# Patient Record
Sex: Female | Born: 1990 | Race: White | Hispanic: No | Marital: Single | Smoking: Never smoker
Health system: Southern US, Community
[De-identification: ages and names within clinical notes are randomized; demographics above are authoritative.]

## PROBLEM LIST (undated history)

## (undated) DIAGNOSIS — K5900 Constipation, unspecified: Secondary | ICD-10-CM

## (undated) DIAGNOSIS — R51 Headache: Secondary | ICD-10-CM

## (undated) DIAGNOSIS — K5909 Other constipation: Secondary | ICD-10-CM

## (undated) DIAGNOSIS — Z5189 Encounter for other specified aftercare: Secondary | ICD-10-CM

## (undated) DIAGNOSIS — N926 Irregular menstruation, unspecified: Secondary | ICD-10-CM

## (undated) DIAGNOSIS — N946 Dysmenorrhea, unspecified: Secondary | ICD-10-CM

## (undated) HISTORY — DX: Other constipation: K59.09

## (undated) HISTORY — DX: Dysmenorrhea, unspecified: N94.6

## (undated) HISTORY — PX: WISDOM TOOTH EXTRACTION: SHX21

## (undated) HISTORY — DX: Constipation, unspecified: K59.00

## (undated) HISTORY — DX: Irregular menstruation, unspecified: N92.6

## (undated) HISTORY — DX: Headache: R51

## (undated) HISTORY — DX: Encounter for other specified aftercare: Z51.89

---
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
## 2010-02-26 ENCOUNTER — Ambulatory Visit: Payer: Self-pay | Admitting: Internal Medicine

## 2010-02-26 DIAGNOSIS — R51 Headache: Secondary | ICD-10-CM | POA: Insufficient documentation

## 2010-02-26 DIAGNOSIS — R519 Headache, unspecified: Secondary | ICD-10-CM | POA: Insufficient documentation

## 2010-02-26 HISTORY — DX: Headache: R51

## 2010-05-08 NOTE — Assessment & Plan Note (Signed)
Summary: HA'S/SSC   Vital Signs:  Patient profile:   20 year old female Menstrual status:  regular LMP:     02/22/2010 Height:      63 inches Weight:      166 pounds BMI:     29.51 Temp:     98.3 degrees F oral Pulse rate:   78 / minute BP sitting:   120 / 80  (right arm) Cuff size:   regular  Vitals Entered By: Romualdo Bolk, CMA Duncan Dull) (February 26, 2010 2:33 PM) CC: Ha's since 11/15 daily since then. No sensitivity to light or sound, No nausea or vomiting. Pt states that she has pressure around her eyes and sometimes it's hard to keep them open., Headache LMP (date): 02/22/2010 LMP - Character: heavy Menarche (age onset years): 12   Menses interval (days): 28 Menstrual flow (days): 7 Menstrual Status regular Enter LMP: 02/22/2010   History of Present Illness: Zakaiya Lares comes in today  for sda for above problem . Onset  5 days ago  with fontal HA  and described as   continuous   but ibuprofen helps some. and then comes back. Onset when    going to event  evening.   Rest helped it and then began again.    worse as day went on. She describes this as a draining HA and tired  hard to function  .   Has tried aleve and didnt help.   frontal HA and to eyes   with throbbing pain .  no vision changes.  Severity 7-10/10 Habits: caffiene  coffee in am  MWF for classes. Etoh: non  since april Artificial sweetners: moms tea.  Schedule no change   no allergy cold symptoms.  Eats 2 meals per day. LMP thursday.  Pattern HA : minor HAs that resolve with rests about    once a months. Takes no meds fot this Fam hx of HA: neg  with mom and sibs.    GYNE:to start  loestin   per Dr Vincente Poli for period control  but to start the end of this week.    Preventive Screening-Counseling & Management  Alcohol-Tobacco     Alcohol drinks/day: 0     Smoking Status: never  Caffeine-Diet-Exercise     Caffeine use/day: 1     Does Patient Exercise: yes  Current Medications (verified): 1)   Loestrin 24 Fe 1-20 Mg-Mcg Tabs (Norethin Ace-Eth Estrad-Fe)  Allergies (verified): No Known Drug Allergies  Past History:  Past medical, surgical, family and social histories (including risk factors) reviewed, and no changes noted (except as noted below).  Past Medical History: Reviewed history from 12/28/2007 and no changes required. hx constipation  Past History:  Care Management: Gynecology: Dr. Adalberto Ill  Family History: Reviewed history and no changes required. Father: Healthy Mother: Healthy Siblings: Healthy  Social History: Reviewed history from 12/28/2007 and no changes required. Single  student  Never Smoked Alcohol use-no Drug use-no Regular exercise-yes   Caffeine use/day:  1  Review of Systems       neg cv pulmonary Gi Gu or skin related  issues.  rest as per HPI  Physical Exam  General:  Well-developed,well-nourished,in no acute distress; alert,appropriate and cooperative throughout examination  looks tired  Head:  normocephalic and atraumatic.   Eyes:  PERRL, EOMs full, conjunctiva clear  Ears:  R ear normal, L ear normal, and no external deformities.   Nose:  no external deformity, no external erythema, and no nasal discharge.  Mouth:  good dentition and pharynx pink and moist.   Neck:  No deformities, masses, or tenderness noted. Lungs:  Normal respiratory effort, chest expands symmetrically. Lungs are clear to auscultation, no crackles or wheezes. Heart:  Normal rate and regular rhythm. S1 and S2 normal without gallop, murmur, click, rub or other extra sounds.no lifts.   Abdomen:  Bowel sounds positive,abdomen soft and non-tender without masses, organomegaly or hernias noted. Pulses:  pulses intact without delay   Extremities:  no clubbing cyanosis or edema  Neurologic:  alert & oriented X3, cranial nerves II-XII intact, strength normal in all extremities, gait normal, and Romberg negative.  excellent balance  Skin:  turgor normal, color normal,  no ecchymoses, and no petechiae.   Cervical Nodes:  No lymphadenopathy noted Psych:  Oriented X3, normally interactive, good eye contact, not anxious appearing, and not depressed appearing.     Impression & Recommendations:  Problem # 1:  HEADACHE (ICD-784.0) no history of significant headache but has had a number of  minor ones. Has some.migraine like  component   a patterned is more consistent with a tension headacheand no other alarm features except severity. She looks rather well for having a 10 / 10 headache but does look tired.   Options for treatment discussed.   will have her do headache calendar to monitor closely. Her updated medication list for this problem includes:    Ketoprofen 50 Mg Caps (Ketoprofen) .Marland Kitchen... 1 by mouth q6-8  hours as needed headache  Orders: Ketorolac-Toradol 15mg  (T7322) Admin of Therapeutic Inj  intramuscular or subcutaneous (02542)  Complete Medication List: 1)  Loestrin 24 Fe 1-20 Mg-mcg Tabs (Norethin ace-eth estrad-fe) 2)  Ketoprofen 50 Mg Caps (Ketoprofen) .Marland Kitchen.. 1 by mouth q6-8  hours as needed headache  Patient Instructions: 1)  avoid caffiene  2)  get enough sleep over the holiday. 3)  can take  new antiinflammatory for HA in the short term.   call if not getting better   4)  calendar any Headache you get   with your periods   in case the HA returns .  5)  Ok to start the OCps  as directed .   Contraindications/Deferment of Procedures/Staging:    Test/Procedure: FLU VAX    Reason for deferment: patient declined  Prescriptions: KETOPROFEN 50 MG CAPS (KETOPROFEN) 1 by mouth q6-8  hours as needed Headache  #20 x 1   Entered and Authorized by:   Madelin Headings MD   Signed by:   Madelin Headings MD on 02/26/2010   Method used:   Electronically to        [id number] (ret[facility])       [address] [place], [place]  [address]       Ph: [phone] or [phone]       Fax: [phone]   RxID:    [id number]    Medication Administration  Injection # 1:    Medication: Ketorolac-Toradol 15mg     Diagnosis: HEADACHE (ICD-784.0)    Route: IM    Site: LUOQ gluteus    Exp Date: 06/07/2011    Lot #: [id number]    Mfr: novaplus    Comments: Gave 60mg     Patient tolerated injection without complications    Given by: Romualdo Bolk, CMA (AAMA) (February 26, 2010 3:15 PM)  Orders Added: 1)  Ketorolac-Toradol 15mg  [J1885] 2)  Admin of  Therapeutic Inj  intramuscular or subcutaneous [96372] 3)  Est. Patient Level IV [16109]

## 2010-09-11 ENCOUNTER — Ambulatory Visit (INDEPENDENT_AMBULATORY_CARE_PROVIDER_SITE_OTHER): Payer: BC Managed Care – PPO | Admitting: Internal Medicine

## 2010-09-11 DIAGNOSIS — Z111 Encounter for screening for respiratory tuberculosis: Secondary | ICD-10-CM

## 2011-11-25 ENCOUNTER — Emergency Department (HOSPITAL_BASED_OUTPATIENT_CLINIC_OR_DEPARTMENT_OTHER)
Admission: EM | Admit: 2011-11-25 | Discharge: 2011-11-25 | Disposition: A | Discharge status: Home / Self Care | Payer: BC Managed Care – PPO | Attending: Emergency Medicine | Admitting: Emergency Medicine

## 2011-11-25 ENCOUNTER — Encounter (HOSPITAL_BASED_OUTPATIENT_CLINIC_OR_DEPARTMENT_OTHER): Payer: Self-pay | Admitting: Family Medicine

## 2011-11-25 ENCOUNTER — Emergency Department (HOSPITAL_BASED_OUTPATIENT_CLINIC_OR_DEPARTMENT_OTHER): Payer: BC Managed Care – PPO

## 2011-11-25 DIAGNOSIS — R112 Nausea with vomiting, unspecified: Secondary | ICD-10-CM | POA: Insufficient documentation

## 2011-11-25 DIAGNOSIS — K529 Noninfective gastroenteritis and colitis, unspecified: Secondary | ICD-10-CM

## 2011-11-25 DIAGNOSIS — K5289 Other specified noninfective gastroenteritis and colitis: Secondary | ICD-10-CM | POA: Insufficient documentation

## 2011-11-25 LAB — CBC WITH DIFFERENTIAL/PLATELET
Basophils Absolute: 0 10*3/uL (ref 0.0–0.1)
Lymphocytes Relative: 14 % (ref 12–46)
Lymphs Abs: 1.5 10*3/uL (ref 0.7–4.0)
MCV: 86.5 fL (ref 78.0–100.0)
Neutro Abs: 8.4 10*3/uL — ABNORMAL HIGH (ref 1.7–7.7)
Neutrophils Relative %: 79 % — ABNORMAL HIGH (ref 43–77)
Platelets: 338 10*3/uL (ref 150–400)
RBC: 4.68 MIL/uL (ref 3.87–5.11)
WBC: 10.6 10*3/uL — ABNORMAL HIGH (ref 4.0–10.5)

## 2011-11-25 LAB — COMPREHENSIVE METABOLIC PANEL
ALT: 20 U/L (ref 0–35)
AST: 23 U/L (ref 0–37)
Alkaline Phosphatase: 91 U/L (ref 39–117)
CO2: 25 mEq/L (ref 19–32)
Chloride: 106 mEq/L (ref 96–112)
GFR calc Af Amer: >90 mL/min (ref >90)
GFR calc non Af Amer: >90 mL/min (ref >90)
Glucose, Bld: 110 mg/dL — ABNORMAL HIGH (ref 70–99)
Potassium: 4.7 mEq/L (ref 3.5–5.1)
Sodium: 141 mEq/L (ref 135–145)

## 2011-11-25 LAB — URINALYSIS, ROUTINE W REFLEX MICROSCOPIC
Bilirubin Urine: NEGATIVE
Glucose, UA: NEGATIVE mg/dL
Hgb urine dipstick: NEGATIVE
Protein, ur: NEGATIVE mg/dL
Urobilinogen, UA: 0.2 mg/dL (ref 0.0–1.0)

## 2011-11-25 LAB — PREGNANCY, URINE: Preg Test, Ur: NEGATIVE

## 2011-11-25 MED ORDER — ONDANSETRON 8 MG PO TBDP: 8.0000 mg | ORAL_TABLET | Freq: Three times a day (TID) | ORAL | Status: AC | PRN

## 2011-11-25 MED ORDER — SODIUM CHLORIDE 0.9 % IV BOLUS (SEPSIS)
1000.0000 mL | Freq: Once | INTRAVENOUS | Status: AC
  Administered 2011-11-25: 1000 mL via INTRAVENOUS

## 2011-11-25 MED ORDER — SODIUM CHLORIDE 0.9 % IV SOLN
80.0000 mg | Freq: Once | INTRAVENOUS | Status: AC
  Administered 2011-11-25: 80 mg via INTRAVENOUS
  Filled 2011-11-25: qty 40

## 2011-11-25 MED ORDER — HYDROMORPHONE HCL PF 1 MG/ML IJ SOLN
1.0000 mg | Freq: Once | INTRAMUSCULAR | Status: AC
  Administered 2011-11-25: 1 mg via INTRAVENOUS
  Filled 2011-11-25 (×2): qty 1

## 2011-11-25 MED ORDER — ONDANSETRON HCL 4 MG/2ML IJ SOLN
4.0000 mg | Freq: Once | INTRAMUSCULAR | Status: AC
  Administered 2011-11-25: 4 mg via INTRAVENOUS
  Filled 2011-11-25: qty 2

## 2011-11-25 NOTE — ED Provider Notes (Signed)
History     CSN: [id number]  Arrival date & time 11/25/11  1033   First MD Initiated Contact with Patient 11/25/11 1053      Chief Complaint  Patient presents with  . Abdominal Pain    (Consider location/radiation/quality/duration/timing/severity/associated sxs/prior treatment) HPI Patient with abdominal pain off and on since Saturday.  Nausea, vomiting and loose stool.No uti symptoms (although nurses notes state dysuria).  No sexual activity, vaginal discharge, or bleeding.    History reviewed. No pertinent past medical history.  Past Surgical History  Procedure Date  . Wisdom tooth extraction     No family history on file.  History  Substance Use Topics  . Smoking status: Never Smoker   . Smokeless tobacco: Not on file  . Alcohol Use: No    OB History    Grav Para Term Preterm Abortions TAB SAB Ect Mult Living                  Review of Systems  All other systems reviewed and are negative.    Allergies  Review of patient's allergies indicates no known allergies.  Home Medications  No current outpatient prescriptions on file.  BP 116/58  Pulse 55  Temp 97.8 F (36.6 C) (Oral)  Resp 16  Ht 5\' 2"  (1.575 m)  Wt 142 lb (64.411 kg)  BMI 25.97 kg/m2  SpO2 100%  LMP 10/28/2011  Physical Exam  Nursing note and vitals reviewed. Constitutional: She appears well-developed and well-nourished.  HENT:  Head: Normocephalic and atraumatic.  Eyes: Conjunctivae and EOM are normal. Pupils are equal, round, and reactive to light.  Neck: Normal range of motion. Neck supple.  Cardiovascular: Normal rate, regular rhythm, normal heart sounds and intact distal pulses.   Pulmonary/Chest: Effort normal and breath sounds normal.  Abdominal: Soft. Bowel sounds are normal. There is tenderness.       ruq and epigastric tenderness  Musculoskeletal: Normal range of motion.  Neurological: She is alert.  Skin: Skin is warm and dry.  Psychiatric: She has a normal mood and  affect. Thought content normal.    ED Course  Procedures (including critical care time)  Labs Reviewed  CBC WITH DIFFERENTIAL - Abnormal; Notable for the following:    WBC 10.6 (*)     Neutrophils Relative 79 (*)     Neutro Abs 8.4 (*)     All other components within normal limits  COMPREHENSIVE METABOLIC PANEL - Abnormal; Notable for the following:    Glucose, Bld 110 (*)     Total Protein 8.4 (*)     Total Bilirubin 0.2 (*)     All other components within normal limits  LIPASE, BLOOD  URINALYSIS, ROUTINE W REFLEX MICROSCOPIC  PREGNANCY, URINE   US Abdomen Complete  11/25/2011  *RADIOLOGY REPORT*  Clinical Data:  Abdominal pain.  Nausea and vomiting.  ABDOMINAL ULTRASOUND COMPLETE  Comparison:  None.  Findings:  Gallbladder:  No gallstones, gallbladder wall thickening, or pericholecystic fluid.  Common Bile Duct:  Within normal limits in caliber. Measures 4 mm in diameter.  Liver: No focal mass lesion identified.  Within normal limits in parenchymal echogenicity.  IVC:  Appears normal.  Pancreas:  No abnormality identified.  Spleen:  Within normal limits in size and echotexture.  Right kidney:  Normal in size and parenchymal echogenicity.  No evidence of mass or hydronephrosis.  Left kidney:  Normal in size and parenchymal echogenicity.  No evidence of mass or hydronephrosis.  Abdominal Aorta:  No  aneurysm identified.  IMPRESSION: Negative abdominal ultrasound.   Original Report Authenticated By: Danae Orleans, M.D. ( 11/25/2011 12:15:38 )      No diagnosis found.    MDM  Patient with negative work up and feel improved.  Abdomen mildly tender in ruq and epigastrium.  No lower abdominal pain.        Hilario Quarry, MD 11/25/11 [phone]

## 2011-11-25 NOTE — ED Notes (Signed)
Pt requesting pain medication at present.

## 2011-11-25 NOTE — ED Notes (Signed)
Pt c/o diffuse abdominal pain x 2 days with 1 episode of vomiting and "soft stool". Last bowel movement this morning. Pt also c/o dysuria.

## 2011-11-25 NOTE — ED Notes (Signed)
Patient transported to Ultrasound 

## 2011-11-25 NOTE — ED Notes (Signed)
Pt reports nausea is gone and pain is not as bad. Pt smiling and talkative. nad noted.

## 2011-11-25 NOTE — ED Notes (Signed)
Prior to giving pain medication pt was ambulatory to the rest room and states she is nauseated.

## 2012-01-28 ENCOUNTER — Encounter: Payer: Self-pay | Admitting: Internal Medicine

## 2012-01-28 ENCOUNTER — Ambulatory Visit (INDEPENDENT_AMBULATORY_CARE_PROVIDER_SITE_OTHER): Payer: BC Managed Care – PPO | Admitting: Internal Medicine

## 2012-01-28 VITALS — BP 112/60 | HR 63 | Temp 98.2°F | Ht 63.0 in | Wt 155.0 lb

## 2012-01-28 DIAGNOSIS — Z0000 Encounter for general adult medical examination without abnormal findings: Secondary | ICD-10-CM

## 2012-01-28 DIAGNOSIS — Z23 Encounter for immunization: Secondary | ICD-10-CM

## 2012-01-28 DIAGNOSIS — Z111 Encounter for screening for respiratory tuberculosis: Secondary | ICD-10-CM

## 2012-01-28 DIAGNOSIS — E049 Nontoxic goiter, unspecified: Secondary | ICD-10-CM

## 2012-01-28 LAB — BASIC METABOLIC PANEL
CO2: 28 mEq/L (ref 19–32)
Calcium: 9.4 mg/dL (ref 8.4–10.5)
Chloride: 106 mEq/L (ref 96–112)
Creatinine, Ser: 0.7 mg/dL (ref 0.4–1.2)
Glucose, Bld: 86 mg/dL (ref 70–99)
Sodium: 140 mEq/L (ref 135–145)

## 2012-01-28 LAB — CBC WITH DIFFERENTIAL/PLATELET
Basophils Absolute: 0 10*3/uL (ref 0.0–0.1)
Basophils Relative: 0.4 % (ref 0.0–3.0)
Eosinophils Absolute: 0.1 10*3/uL (ref 0.0–0.7)
Hemoglobin: 13.5 g/dL (ref 12.0–15.0)
Lymphocytes Relative: 28.3 % (ref 12.0–46.0)
MCHC: 32.8 g/dL (ref 30.0–36.0)
MCV: 90.8 fl (ref 78.0–100.0)
Monocytes Absolute: 0.4 10*3/uL (ref 0.1–1.0)
Neutro Abs: 3.9 10*3/uL (ref 1.4–7.7)
RBC: 4.55 Mil/uL (ref 3.87–5.11)
RDW: 13.2 % (ref 11.5–14.6)

## 2012-01-28 LAB — LIPID PANEL
Cholesterol: 147 mg/dL (ref 0–200)
HDL: 41.2 mg/dL (ref >39.00)
Triglycerides: 93 mg/dL (ref 0.0–149.0)
VLDL: 18.6 mg/dL (ref 0.0–40.0)

## 2012-01-28 LAB — HEPATIC FUNCTION PANEL
Albumin: 4.2 g/dL (ref 3.5–5.2)
Total Protein: 7.7 g/dL (ref 6.0–8.3)

## 2012-01-28 NOTE — Patient Instructions (Signed)
Get copy of immunization records. PPD today come back for reading  And then will need another  After that ( for booster effect.)  May need second meneingitis or mcv 4 but could choose to not do this. May need booster tdap. Will notify you  of labs when available.   Continue lifestyle intervention healthy eating and exercise . Preventive Care for Adults, Female A healthy lifestyle and preventive care can promote health and wellness. Preventive health guidelines for women include the following key practices.  A routine yearly physical is a good way to check with your caregiver about your health and preventive screening. It is a chance to share any concerns and updates on your health, and to receive a thorough exam.  Visit your dentist for a routine exam and preventive care every 6 months. Brush your teeth twice a day and floss once a day. Good oral hygiene prevents tooth decay and gum disease.  The frequency of eye exams is based on your age, health, family medical history, use of contact lenses, and other factors. Follow your caregiver's recommendations for frequency of eye exams.  Eat a healthy diet. Foods like vegetables, fruits, whole grains, low-fat dairy products, and lean protein foods contain the nutrients you need without too many calories. Decrease your intake of foods high in solid fats, added sugars, and salt. Eat the right amount of calories for you.Get information about a proper diet from your caregiver, if necessary.  Regular physical exercise is one of the most important things you can do for your health. Most adults should get at least 150 minutes of moderate-intensity exercise (any activity that increases your heart rate and causes you to sweat) each week. In addition, most adults need muscle-strengthening exercises on 2 or more days a week.  Maintain a healthy weight. The body mass index (BMI) is a screening tool to identify possible weight problems. It provides an estimate of  body fat based on height and weight. Your caregiver can help determine your BMI, and can help you achieve or maintain a healthy weight.For adults 20 years and older:  A BMI below 18.5 is considered underweight.  A BMI of 18.5 to 24.9 is normal.  A BMI of 25 to 29.9 is considered overweight.  A BMI of 30 and above is considered obese.  Maintain normal blood lipids and cholesterol levels by exercising and minimizing your intake of saturated fat. Eat a balanced diet with plenty of fruit and vegetables. Blood tests for lipids and cholesterol should begin at age 85 and be repeated every 5 years. If your lipid or cholesterol levels are high, you are over 50, or you are at high risk for heart disease, you may need your cholesterol levels checked more frequently.Ongoing high lipid and cholesterol levels should be treated with medicines if diet and exercise are not effective.  If you smoke, find out from your caregiver how to quit. If you do not use tobacco, do not start.  If you are pregnant, do not drink alcohol. If you are breastfeeding, be very cautious about drinking alcohol. If you are not pregnant and choose to drink alcohol, do not exceed 1 drink per day. One drink is considered to be 12 ounces (355 mL) of beer, 5 ounces (148 mL) of wine, or 1.5 ounces (44 mL) of liquor.  Avoid use of street drugs. Do not share needles with anyone. Ask for help if you need support or instructions about stopping the use of drugs.  High blood pressure  causes heart disease and increases the risk of stroke. Your blood pressure should be checked at least every 1 to 2 years. Ongoing high blood pressure should be treated with medicines if weight loss and exercise are not effective.  If you are 69 to 21 years old, ask your caregiver if you should take aspirin to prevent strokes.  Diabetes screening involves taking a blood sample to check your fasting blood sugar level. This should be done once every 3 years, after age  60, if you are within normal weight and without risk factors for diabetes. Testing should be considered at a younger age or be carried out more frequently if you are overweight and have at least 1 risk factor for diabetes.  Breast cancer screening is essential preventive care for women. You should practice "breast self-awareness." This means understanding the normal appearance and feel of your breasts and may include breast self-examination. Any changes detected, no matter how small, should be reported to a caregiver. Women in their 64s and 30s should have a clinical breast exam (CBE) by a caregiver as part of a regular health exam every 1 to 3 years. After age 61, women should have a CBE every year. Starting at age 65, women should consider having a mammography (breast X-ray test) every year. Women who have a family history of breast cancer should talk to their caregiver about genetic screening. Women at a high risk of breast cancer should talk to their caregivers about having magnetic resonance imaging (MRI) and a mammography every year.  The Pap test is a screening test for cervical cancer. A Pap test can show cell changes on the cervix that might become cervical cancer if left untreated. A Pap test is a procedure in which cells are obtained and examined from the lower end of the uterus (cervix).  Women should have a Pap test starting at age 64.  Between ages 29 and 44, Pap tests should be repeated every 2 years.  Beginning at age 66, you should have a Pap test every 3 years as long as the past 3 Pap tests have been normal.  Some women have medical problems that increase the chance of getting cervical cancer. Talk to your caregiver about these problems. It is especially important to talk to your caregiver if a new problem develops soon after your last Pap test. In these cases, your caregiver may recommend more frequent screening and Pap tests.  The above recommendations are the same for women who  have or have not gotten the vaccine for human papillomavirus (HPV).  If you had a hysterectomy for a problem that was not cancer or a condition that could lead to cancer, then you no longer need Pap tests. Even if you no longer need a Pap test, a regular exam is a good idea to make sure no other problems are starting.  If you are between ages 62 and 24, and you have had normal Pap tests going back 10 years, you no longer need Pap tests. Even if you no longer need a Pap test, a regular exam is a good idea to make sure no other problems are starting.  If you have had past treatment for cervical cancer or a condition that could lead to cancer, you need Pap tests and screening for cancer for at least 20 years after your treatment.  If Pap tests have been discontinued, risk factors (such as a new sexual partner) need to be reassessed to determine if screening should be  resumed.  The HPV test is an additional test that may be used for cervical cancer screening. The HPV test looks for the virus that can cause the cell changes on the cervix. The cells collected during the Pap test can be tested for HPV. The HPV test could be used to screen women aged 37 years and older, and should be used in women of any age who have unclear Pap test results. After the age of 39, women should have HPV testing at the same frequency as a Pap test.  Colorectal cancer can be detected and often prevented. Most routine colorectal cancer screening begins at the age of 65 and continues through age 21. However, your caregiver may recommend screening at an earlier age if you have risk factors for colon cancer. On a yearly basis, your caregiver may provide home test kits to check for hidden blood in the stool. Use of a small camera at the end of a tube, to directly examine the colon (sigmoidoscopy or colonoscopy), can detect the earliest forms of colorectal cancer. Talk to your caregiver about this at age 58, when routine screening begins.  Direct examination of the colon should be repeated every 5 to 10 years through age 51, unless early forms of pre-cancerous polyps or small growths are found.  Hepatitis C blood testing is recommended for all people born from [birth year] through 1965 and any individual with known risks for hepatitis C.  Practice safe sex. Use condoms and avoid high-risk sexual practices to reduce the spread of sexually transmitted infections (STIs). STIs include gonorrhea, chlamydia, syphilis, trichomonas, herpes, HPV, and human immunodeficiency virus (HIV). Herpes, HIV, and HPV are viral illnesses that have no cure. They can result in disability, cancer, and death. Sexually active women aged 13 and younger should be checked for chlamydia. Older women with new or multiple partners should also be tested for chlamydia. Testing for other STIs is recommended if you are sexually active and at increased risk.  Osteoporosis is a disease in which the bones lose minerals and strength with aging. This can result in serious bone fractures. The risk of osteoporosis can be identified using a bone density scan. Women ages 23 and over and women at risk for fractures or osteoporosis should discuss screening with their caregivers. Ask your caregiver whether you should take a calcium supplement or vitamin D to reduce the rate of osteoporosis.  Menopause can be associated with physical symptoms and risks. Hormone replacement therapy is available to decrease symptoms and risks. You should talk to your caregiver about whether hormone replacement therapy is right for you.  Use sunscreen with sun protection factor (SPF) of 30 or more. Apply sunscreen liberally and repeatedly throughout the day. You should seek shade when your shadow is shorter than you. Protect yourself by wearing long sleeves, pants, a wide-brimmed hat, and sunglasses year round, whenever you are outdoors.  Once a month, do a whole body skin exam, using a mirror to look at the skin  on your back. Notify your caregiver of new moles, moles that have irregular borders, moles that are larger than a pencil eraser, or moles that have changed in shape or color.  Stay current with required immunizations.  Influenza. You need a dose every fall (or winter). The composition of the flu vaccine changes each year, so being vaccinated once is not enough.  Pneumococcal polysaccharide. You need 1 to 2 doses if you smoke cigarettes or if you have certain chronic medical conditions. You need 1  dose at age 25 (or older) if you have never been vaccinated.  Tetanus, diphtheria, pertussis (Tdap, Td). Get 1 dose of Tdap vaccine if you are younger than age 9, are over 21 and have contact with an infant, are a Research scientist (physical sciences), are pregnant, or simply want to be protected from whooping cough. After that, you need a Td booster dose every 10 years. Consult your caregiver if you have not had at least 3 tetanus and diphtheria-containing shots sometime in your life or have a deep or dirty wound.  HPV. You need this vaccine if you are a woman age 79 or younger. The vaccine is given in 3 doses over 6 months.  Measles, mumps, rubella (MMR). You need at least 1 dose of MMR if you were born in 1957 or later. You may also need a second dose.  Meningococcal. If you are age 61 to 55 and a first-year college student living in a residence hall, or have one of several medical conditions, you need to get vaccinated against meningococcal disease. You may also need additional booster doses.  Zoster (shingles). If you are age 41 or older, you should get this vaccine.  Varicella (chickenpox). If you have never had chickenpox or you were vaccinated but received only 1 dose, talk to your caregiver to find out if you need this vaccine.  Hepatitis A. You need this vaccine if you have a specific risk factor for hepatitis A virus infection or you simply wish to be protected from this disease. The vaccine is usually given as  2 doses, 6 to 18 months apart.  Hepatitis B. You need this vaccine if you have a specific risk factor for hepatitis B virus infection or you simply wish to be protected from this disease. The vaccine is given in 3 doses, usually over 6 months. Preventive Services / Frequency Ages 17 to 34  Blood pressure check.** / Every 1 to 2 years.  Lipid and cholesterol check.** / Every 5 years beginning at age 53.  Clinical breast exam.** / Every 3 years for women in their 53s and 30s.  Pap test.** / Every 2 years from ages 11 through 69. Every 3 years starting at age 26 through age 7 or 35 with a history of 3 consecutive normal Pap tests.  HPV screening.** / Every 3 years from ages 19 through ages 54 to 10 with a history of 3 consecutive normal Pap tests.  Hepatitis C blood test.** / For any individual with known risks for hepatitis C.  Skin self-exam. / Monthly.  Influenza immunization.** / Every year.  Pneumococcal polysaccharide immunization.** / 1 to 2 doses if you smoke cigarettes or if you have certain chronic medical conditions.  Tetanus, diphtheria, pertussis (Tdap, Td) immunization. / A one-time dose of Tdap vaccine. After that, you need a Td booster dose every 10 years.  HPV immunization. / 3 doses over 6 months, if you are 64 and younger.  Measles, mumps, rubella (MMR) immunization. / You need at least 1 dose of MMR if you were born in 1957 or later. You may also need a second dose.  Meningococcal immunization. / 1 dose if you are age 5 to 31 and a first-year college student living in a residence hall, or have one of several medical conditions, you need to get vaccinated against meningococcal disease. You may also need additional booster doses.  Varicella immunization.** / Consult your caregiver.  Hepatitis A immunization.** / Consult your caregiver. 2 doses, 6 to 18 months  apart.  Hepatitis B immunization.** / Consult your caregiver. 3 doses usually over 6 months. Ages 16 to  54  Blood pressure check.** / Every 1 to 2 years.  Lipid and cholesterol check.** / Every 5 years beginning at age 58.  Clinical breast exam.** / Every year after age 68.  Mammogram.** / Every year beginning at age 46 and continuing for as long as you are in good health. Consult with your caregiver.  Pap test.** / Every 3 years starting at age 74 through age 76 or 54 with a history of 3 consecutive normal Pap tests.  HPV screening.** / Every 3 years from ages 8 through ages 33 to 77 with a history of 3 consecutive normal Pap tests.  Fecal occult blood test (FOBT) of stool. / Every year beginning at age 33 and continuing until age 29. You may not need to do this test if you get a colonoscopy every 10 years.  Flexible sigmoidoscopy or colonoscopy.** / Every 5 years for a flexible sigmoidoscopy or every 10 years for a colonoscopy beginning at age 23 and continuing until age 2.  Hepatitis C blood test.** / For all people born from [birth year] through 1965 and any individual with known risks for hepatitis C.  Skin self-exam. / Monthly.  Influenza immunization.** / Every year.  Pneumococcal polysaccharide immunization.** / 1 to 2 doses if you smoke cigarettes or if you have certain chronic medical conditions.  Tetanus, diphtheria, pertussis (Tdap, Td) immunization.** / A one-time dose of Tdap vaccine. After that, you need a Td booster dose every 10 years.  Measles, mumps, rubella (MMR) immunization. / You need at least 1 dose of MMR if you were born in 1957 or later. You may also need a second dose.  Varicella immunization.** / Consult your caregiver.  Meningococcal immunization.** / Consult your caregiver.  Hepatitis A immunization.** / Consult your caregiver. 2 doses, 6 to 18 months apart.  Hepatitis B immunization.** / Consult your caregiver. 3 doses, usually over 6 months. Ages 35 and over  Blood pressure check.** / Every 1 to 2 years.  Lipid and cholesterol check.** / Every 5 years  beginning at age 77.  Clinical breast exam.** / Every year after age 81.  Mammogram.** / Every year beginning at age 89 and continuing for as long as you are in good health. Consult with your caregiver.  Pap test.** / Every 3 years starting at age 47 through age 27 or 73 with a 3 consecutive normal Pap tests. Testing can be stopped between 65 and 70 with 3 consecutive normal Pap tests and no abnormal Pap or HPV tests in the past 10 years.  HPV screening.** / Every 3 years from ages 23 through ages 30 or 82 with a history of 3 consecutive normal Pap tests. Testing can be stopped between 65 and 70 with 3 consecutive normal Pap tests and no abnormal Pap or HPV tests in the past 10 years.  Fecal occult blood test (FOBT) of stool. / Every year beginning at age 73 and continuing until age 79. You may not need to do this test if you get a colonoscopy every 10 years.  Flexible sigmoidoscopy or colonoscopy.** / Every 5 years for a flexible sigmoidoscopy or every 10 years for a colonoscopy beginning at age 35 and continuing until age 8.  Hepatitis C blood test.** / For all people born from [birth year] through 1965 and any individual with known risks for hepatitis C.  Osteoporosis screening.** / A one-time screening  for women ages 78 and over and women at risk for fractures or osteoporosis.  Skin self-exam. / Monthly.  Influenza immunization.** / Every year.  Pneumococcal polysaccharide immunization.** / 1 dose at age 38 (or older) if you have never been vaccinated.  Tetanus, diphtheria, pertussis (Tdap, Td) immunization. / A one-time dose of Tdap vaccine if you are over 65 and have contact with an infant, are a Research scientist (physical sciences), or simply want to be protected from whooping cough. After that, you need a Td booster dose every 10 years.  Varicella immunization.** / Consult your caregiver.  Meningococcal immunization.** / Consult your caregiver.  Hepatitis A immunization.** / Consult your caregiver. 2  doses, 6 to 18 months apart.  Hepatitis B immunization.** / Check with your caregiver. 3 doses, usually over 6 months. ** Family history and personal history of risk and conditions may change your caregiver's recommendations. Document Released: 05/21/2001 Document Revised: 06/17/2011 Document Reviewed: 08/20/2010 [facility] Patient Information 2013 [place], [place].

## 2012-01-28 NOTE — Progress Notes (Signed)
  Subjective:    Patient ID: Angela Jordan, female    DOB: [birth year], 21 y.o.   MRN: [id number]  HPI Pt comes in today for wellness visit  And forms and update immuniz for school . Her last visit here was 11/ 11; 2 years ago  Since that time: irreg menses  Sees dr Vincente Poli. Doing better  No more headaches  No major injuries of illnesses  Had varicella as a child  Hs had gardisil vaccine x 23  ? Last tdap  MCV4 .  Needs ppd 2 step for program no hx of positive test or sx of tb.  Sees eye doc has glasses   Review of Systems ROS:  GEN/ HEENT: No fever, significant weight changes sweats headaches vision problems hearing changes, CV/ PULM; No chest pain shortness of breath cough, syncope,edema  change in exercise tolerance. GI /GU: No adominal pain, vomiting, change in bowel habits. No blood in the stool. No significant GU symptoms. SKIN/HEME: ,no acute skin rashes suspicious lesions or bleeding. No lymphadenopathy, nodules, masses.  NEURO/ PSYCH:  No neurologic signs such as weakness numbness. No depression anxiety. IMM/ Allergy: No unusual infections.  Allergy .   REST of 12 system review negative except as per HPI Past history family history social history reviewed in the electronic medical record. fam hx [place]    Objective:   Physical Exam BP 112/60  Pulse 63  Temp 98.2 F (36.8 C) (Oral)  Ht 5\' 3"  (1.6 m)  Wt 155 lb (70.308 kg)  BMI 27.46 kg/m2  SpO2 99%  LMP 01/25/2012 Physical Exam: Vital signs reviewed WUJ:WJXB is a well-developed well-nourished alert cooperative  white female who appears her stated age in no acute distress.  HEENT: normocephalic atraumatic , Eyes: PERRL EOM's full, conjunctiva clear, Nares: paten,t no deformity discharge or tenderness., Ears: no deformity EAC's clear TMs with normal landmarks. Mouth: clear OP, no lesions, edema.  Moist mucous membranes. Dentition in adequate repair. NECK: supple without masses, or bruits. ? Palpable thyroid gland  No  nodules CHEST/PULM:  Clear to auscultation and percussion breath sounds equal no wheeze , rales or rhonchi. No chest wall deformities or tenderness. CV: PMI is nondisplaced, S1 S2 no gallops, murmurs, rubs. Peripheral pulses are full without delay.No JVD .  ABDOMEN: Bowel sounds normal nontender  No guard or rebound, no hepato splenomegal no CVA tenderness.  No hernia. Extremtities:  No clubbing cyanosis or edema, no acute joint swelling or redness no focal atrophy NEURO:  Oriented x3, cranial nerves 3-12 appear to be intact, no obvious focal weakness,gait within normal limits no abnormal reflexes or asymmetrical SKIN: No acute rashes normal turgor, color, no bruising or petechiae. PSYCH: Oriented, good eye contact, no obvious depression anxiety, cognition and judgment appear normal. LN: no cervical axillary inguinal adenopathy  / MS exam: normal;  No scoliosis ,LOM , joint swelling or gait disturbance . Muscle mass is normal .     Assessment & Plan:  Preventive Health Care Counseled regarding healthy nutrition, exercise, sleep, injury prevention, calcium vit d and healthy weight . Flu vaccine  Mist.  Get copy of immunization   from school as they are not in registry and  Not done here. May need update tdap get serology for varicella and cpx labs and screening.  PPD and return for 2 step process Form reviewed and signed  ? Of a goiter get labs today follow yearly  No fam hjx

## 2012-01-29 LAB — HIV ANTIBODY (ROUTINE TESTING W REFLEX): HIV: NONREACTIVE

## 2012-01-29 LAB — RPR

## 2012-01-29 LAB — THYROID ANTIBODIES
Thyroglobulin Ab: <20 U/mL (ref <40.0)
Thyroperoxidase Ab SerPl-aCnc: 15.5 IU/mL (ref <35.0)

## 2012-01-30 LAB — TB SKIN TEST: TB Skin Test: NEGATIVE

## 2012-02-01 ENCOUNTER — Encounter: Payer: Self-pay | Admitting: Internal Medicine

## 2012-02-01 DIAGNOSIS — Z0000 Encounter for general adult medical examination without abnormal findings: Secondary | ICD-10-CM | POA: Insufficient documentation

## 2012-02-01 DIAGNOSIS — E049 Nontoxic goiter, unspecified: Secondary | ICD-10-CM | POA: Insufficient documentation

## 2012-02-01 DIAGNOSIS — Z111 Encounter for screening for respiratory tuberculosis: Secondary | ICD-10-CM | POA: Insufficient documentation

## 2012-02-04 ENCOUNTER — Encounter: Payer: Self-pay | Admitting: Family Medicine

## 2012-02-05 ENCOUNTER — Telehealth: Payer: Self-pay | Admitting: Internal Medicine

## 2012-02-05 DIAGNOSIS — Z111 Encounter for screening for respiratory tuberculosis: Secondary | ICD-10-CM

## 2012-02-05 NOTE — Telephone Encounter (Signed)
We can get a chest x ray but she doesn't have a positive PPD by definition of 8 mm induration.  Is that what  She is saying ?

## 2012-02-05 NOTE — Telephone Encounter (Signed)
Spoke to the pt.  She stated her school program wants her to proceed with getting a chest x-ray before the second ppd.  Please advise.  Thanks!!

## 2012-02-05 NOTE — Telephone Encounter (Signed)
Pt called and said that she needs to get a chest xray ordered since she has has positive tb reading before. Pt is schd for tb test on 02/12/12 and would like to get xray done before then.

## 2012-02-06 ENCOUNTER — Encounter: Payer: Self-pay | Admitting: Internal Medicine

## 2012-02-06 ENCOUNTER — Other Ambulatory Visit: Payer: Self-pay | Admitting: Family Medicine

## 2012-02-06 DIAGNOSIS — Z111 Encounter for screening for respiratory tuberculosis: Secondary | ICD-10-CM

## 2012-02-06 NOTE — Telephone Encounter (Signed)
What dx code should we use?

## 2012-02-06 NOTE — Telephone Encounter (Signed)
Unsure  She doesn't have a positive ppd and dont want to put that in her record.   Could do screening for lung diseas or screening for TB?

## 2012-02-07 NOTE — Telephone Encounter (Signed)
Orders placed in the system and pt notified to go to [place] office.

## 2012-02-12 ENCOUNTER — Ambulatory Visit (INDEPENDENT_AMBULATORY_CARE_PROVIDER_SITE_OTHER): Payer: BC Managed Care – PPO | Admitting: Family Medicine

## 2012-02-12 DIAGNOSIS — Z23 Encounter for immunization: Secondary | ICD-10-CM

## 2012-02-14 ENCOUNTER — Telehealth: Payer: Self-pay | Admitting: Family Medicine

## 2012-02-14 NOTE — Telephone Encounter (Signed)
Left message for the pt to return my call. 

## 2012-02-14 NOTE — Telephone Encounter (Signed)
i'm ok with no x ray but thought she needed it for school.  Ask pt if she still needs x ray ?

## 2012-02-14 NOTE — Telephone Encounter (Signed)
We received notification that this patient did not complete the xray ordered 02/07/12.

## 2012-02-17 ENCOUNTER — Other Ambulatory Visit: Payer: Self-pay | Admitting: Family Medicine

## 2012-02-17 NOTE — Telephone Encounter (Signed)
Spoke to the pt.  She no longer wanted x-ray.  I removed the order.

## 2013-05-07 ENCOUNTER — Emergency Department (HOSPITAL_BASED_OUTPATIENT_CLINIC_OR_DEPARTMENT_OTHER)
Admission: EM | Admit: 2013-05-07 | Discharge: 2013-05-07 | Disposition: A | Discharge status: Home / Self Care | Payer: BC Managed Care – PPO | Attending: Emergency Medicine | Admitting: Emergency Medicine

## 2013-05-07 ENCOUNTER — Emergency Department (HOSPITAL_BASED_OUTPATIENT_CLINIC_OR_DEPARTMENT_OTHER): Payer: BC Managed Care – PPO

## 2013-05-07 ENCOUNTER — Encounter (HOSPITAL_BASED_OUTPATIENT_CLINIC_OR_DEPARTMENT_OTHER): Payer: Self-pay | Admitting: Emergency Medicine

## 2013-05-07 DIAGNOSIS — R1033 Periumbilical pain: Secondary | ICD-10-CM | POA: Insufficient documentation

## 2013-05-07 DIAGNOSIS — Z8719 Personal history of other diseases of the digestive system: Secondary | ICD-10-CM | POA: Insufficient documentation

## 2013-05-07 DIAGNOSIS — N390 Urinary tract infection, site not specified: Secondary | ICD-10-CM

## 2013-05-07 DIAGNOSIS — Z8742 Personal history of other diseases of the female genital tract: Secondary | ICD-10-CM | POA: Insufficient documentation

## 2013-05-07 DIAGNOSIS — Z3202 Encounter for pregnancy test, result negative: Secondary | ICD-10-CM | POA: Insufficient documentation

## 2013-05-07 DIAGNOSIS — Z792 Long term (current) use of antibiotics: Secondary | ICD-10-CM | POA: Insufficient documentation

## 2013-05-07 DIAGNOSIS — R112 Nausea with vomiting, unspecified: Secondary | ICD-10-CM | POA: Insufficient documentation

## 2013-05-07 DIAGNOSIS — R109 Unspecified abdominal pain: Secondary | ICD-10-CM

## 2013-05-07 LAB — COMPREHENSIVE METABOLIC PANEL
ALBUMIN: 4.5 g/dL (ref 3.5–5.2)
ALT: 10 U/L (ref 0–35)
AST: 14 U/L (ref 0–37)
Alkaline Phosphatase: 92 U/L (ref 39–117)
BILIRUBIN TOTAL: 0.5 mg/dL (ref 0.3–1.2)
BUN: 12 mg/dL (ref 6–23)
CHLORIDE: 100 meq/L (ref 96–112)
CO2: 25 mEq/L (ref 19–32)
CREATININE: 0.7 mg/dL (ref 0.50–1.10)
Calcium: 10 mg/dL (ref 8.4–10.5)
GFR calc Af Amer: >90 mL/min (ref >90)
GFR calc non Af Amer: >90 mL/min (ref >90)
Glucose, Bld: 95 mg/dL (ref 70–99)
POTASSIUM: 4.1 meq/L (ref 3.7–5.3)
Sodium: 141 mEq/L (ref 137–147)
TOTAL PROTEIN: 9 g/dL — AB (ref 6.0–8.3)

## 2013-05-07 LAB — CBC WITH DIFFERENTIAL/PLATELET
Basophils Absolute: 0 10*3/uL (ref 0.0–0.1)
Basophils Relative: 0 % (ref 0–1)
EOS ABS: 0 10*3/uL (ref 0.0–0.7)
Eosinophils Relative: 0 % (ref 0–5)
HCT: 43.7 % (ref 36.0–46.0)
Hemoglobin: 14.8 g/dL (ref 12.0–15.0)
LYMPHS ABS: 2.2 10*3/uL (ref 0.7–4.0)
Lymphocytes Relative: 19 % (ref 12–46)
MCH: 29 pg (ref 26.0–34.0)
MCHC: 33.9 g/dL (ref 30.0–36.0)
MCV: 85.7 fL (ref 78.0–100.0)
Monocytes Absolute: 1 10*3/uL (ref 0.1–1.0)
Monocytes Relative: 8 % (ref 3–12)
NEUTROS PCT: 73 % (ref 43–77)
Neutro Abs: 8.5 10*3/uL — ABNORMAL HIGH (ref 1.7–7.7)
PLATELETS: 469 10*3/uL — AB (ref 150–400)
RBC: 5.1 MIL/uL (ref 3.87–5.11)
RDW: 13 % (ref 11.5–15.5)
WBC: 11.6 10*3/uL — ABNORMAL HIGH (ref 4.0–10.5)

## 2013-05-07 LAB — URINE MICROSCOPIC-ADD ON

## 2013-05-07 LAB — URINALYSIS, ROUTINE W REFLEX MICROSCOPIC
Bilirubin Urine: NEGATIVE
Glucose, UA: NEGATIVE mg/dL
KETONES UR: NEGATIVE mg/dL
LEUKOCYTES UA: NEGATIVE
NITRITE: NEGATIVE
PH: 6 (ref 5.0–8.0)
Protein, ur: NEGATIVE mg/dL
SPECIFIC GRAVITY, URINE: 1.021 (ref 1.005–1.030)
Urobilinogen, UA: 0.2 mg/dL (ref 0.0–1.0)

## 2013-05-07 LAB — PREGNANCY, URINE: PREG TEST UR: NEGATIVE

## 2013-05-07 LAB — LIPASE, BLOOD: LIPASE: 22 U/L (ref 11–59)

## 2013-05-07 MED ORDER — ONDANSETRON 4 MG PO TBDP: 4.0000 mg | ORAL_TABLET | Freq: Three times a day (TID) | ORAL | Status: DC | PRN

## 2013-05-07 MED ORDER — IOHEXOL 300 MG/ML  SOLN
100.0000 mL | Freq: Once | INTRAMUSCULAR | Status: AC | PRN
  Administered 2013-05-07: 100 mL via INTRAVENOUS

## 2013-05-07 MED ORDER — DEXTROSE 5 % IV SOLN
1.0000 g | Freq: Once | INTRAVENOUS | Status: AC
  Administered 2013-05-07: 1 g via INTRAVENOUS

## 2013-05-07 MED ORDER — IOHEXOL 300 MG/ML  SOLN
25.0000 mL | Freq: Once | INTRAMUSCULAR | Status: AC | PRN
  Administered 2013-05-07: 25 mL via ORAL

## 2013-05-07 MED ORDER — HYDROCODONE-ACETAMINOPHEN 5-325 MG PO TABS: 1.0000 | ORAL_TABLET | Freq: Four times a day (QID) | ORAL | Status: DC | PRN

## 2013-05-07 MED ORDER — CEPHALEXIN 500 MG PO CAPS: 500.0000 mg | ORAL_CAPSULE | Freq: Four times a day (QID) | ORAL | Status: DC

## 2013-05-07 MED ORDER — CEFTRIAXONE SODIUM 1 G IJ SOLR
INTRAMUSCULAR | Status: AC
  Filled 2013-05-07: qty 10

## 2013-05-07 MED ORDER — SODIUM CHLORIDE 0.9 % IV BOLUS (SEPSIS)
250.0000 mL | Freq: Once | INTRAVENOUS | Status: AC
  Administered 2013-05-07: 250 mL via INTRAVENOUS

## 2013-05-07 MED ORDER — HYDROMORPHONE HCL PF 1 MG/ML IJ SOLN
1.0000 mg | Freq: Once | INTRAMUSCULAR | Status: AC
  Administered 2013-05-07: 1 mg via INTRAVENOUS
  Filled 2013-05-07: qty 1

## 2013-05-07 MED ORDER — ONDANSETRON HCL 4 MG/2ML IJ SOLN
4.0000 mg | Freq: Once | INTRAMUSCULAR | Status: AC
  Administered 2013-05-07: 4 mg via INTRAVENOUS
  Filled 2013-05-07: qty 2

## 2013-05-07 MED ORDER — SODIUM CHLORIDE 0.9 % IV SOLN: INTRAVENOUS | Status: DC

## 2013-05-07 NOTE — ED Notes (Signed)
Patient transported to CT 

## 2013-05-07 NOTE — ED Provider Notes (Signed)
CSN: [id number]     Arrival date & time 05/07/13  1015 History   First MD Initiated Contact with Patient 05/07/13 1041     Chief Complaint  Patient presents with  . Nausea  . Abdominal Pain   (Consider location/radiation/quality/duration/timing/severity/associated sxs/prior Treatment) Patient is a 23 y.o. female presenting with abdominal pain.  Abdominal Pain Associated symptoms: nausea and vomiting   Associated symptoms: no chest pain, no diarrhea, no dysuria, no fever, no hematuria, no shortness of breath, no vaginal bleeding and no vaginal discharge    patient with onset of periumbilical Donald pain on Tuesday associated with nausea and vomiting last night and got some vomiting the day before. No diarrhea last measured. Was last week no dysuria no vaginal discharge. No history of similar pain. Patient states that the abdominal pain is 5/10. No history of similar pain. Pain described as a sharp ache no blood in the vomit pain is nonradiating.  Past Medical History  Diagnosis Date  . Constipation   . CONSTIPATION, CHRONIC 06/04/2007    Qualifier: Diagnosis of  By: Sherren Mocha MD, Jory Ee   . Headache(784.0) 02/26/2010    Qualifier: Diagnosis of  By: Regis Bill MD, Standley Brooking   . Irregular menses     Dr Helane Rima   Past Surgical History  Procedure Laterality Date  . Wisdom tooth extraction     Family History  Problem Relation Age of Onset  . Diabetes type II     History  Substance Use Topics  . Smoking status: Never Smoker   . Smokeless tobacco: Not on file  . Alcohol Use: No   OB History   Grav Para Term Preterm Abortions TAB SAB Ect Mult Living                 Review of Systems  Constitutional: Negative for fever.  HENT: Negative for congestion.   Eyes: Negative for redness.  Respiratory: Negative for shortness of breath.   Cardiovascular: Negative for chest pain.  Gastrointestinal: Positive for nausea, vomiting and abdominal pain. Negative for diarrhea.  Genitourinary: Negative  for dysuria, hematuria, vaginal bleeding and vaginal discharge.  Musculoskeletal: Negative for back pain.  Skin: Negative for rash.  Neurological: Negative for headaches.  Hematological: Does not bruise/bleed easily.  Psychiatric/Behavioral: Negative for confusion.    Allergies  Review of patient's allergies indicates no known allergies.  Home Medications   Current Outpatient Rx  Name  Route  Sig  Dispense  Refill  . cephALEXin (KEFLEX) 500 MG capsule   Oral   Take 1 capsule (500 mg total) by mouth 4 (four) times daily.   28 capsule   0   . HYDROcodone-acetaminophen (NORCO/VICODIN) 5-325 MG per tablet   Oral   Take 1-2 tablets by mouth every 6 (six) hours as needed for moderate pain.   10 tablet   0   . ondansetron (ZOFRAN ODT) 4 MG disintegrating tablet   Oral   Take 1 tablet (4 mg total) by mouth every 8 (eight) hours as needed for nausea or vomiting.   12 tablet   1    BP 115/55  Pulse 61  Temp(Src) 97.9 F (36.6 C) (Oral)  Resp 16  Ht 5\' 3"  (1.6 m)  Wt 160 lb (72.576 kg)  BMI 28.35 kg/m2  SpO2 100%  LMP 04/26/2013 Physical Exam  Nursing note and vitals reviewed. Constitutional: She is oriented to person, place, and time. She appears well-developed and well-nourished. No distress.  HENT:  Head: Normocephalic.  Mouth/Throat:  Oropharynx is clear and moist.  Eyes: Conjunctivae and EOM are normal. Pupils are equal, round, and reactive to light.  Neck: Normal range of motion.  Cardiovascular: Normal rate, regular rhythm and normal heart sounds.   No murmur heard. Abdominal: Soft. Bowel sounds are normal. There is tenderness.  Mild tenderness to palpation periumbilical area. No lower quadrant tenderness.  Musculoskeletal: Normal range of motion.  Neurological: She is alert and oriented to person, place, and time. No cranial nerve deficit. She exhibits normal muscle tone. Coordination normal.  Skin: Skin is warm. No rash noted.    ED Course  Procedures  (including critical care time) Labs Review Labs Reviewed  URINALYSIS, ROUTINE W REFLEX MICROSCOPIC - Abnormal; Notable for the following:    Hgb urine dipstick SMALL (*)    All other components within normal limits  COMPREHENSIVE METABOLIC PANEL - Abnormal; Notable for the following:    Total Protein 9.0 (*)    All other components within normal limits  CBC WITH DIFFERENTIAL - Abnormal; Notable for the following:    WBC 11.6 (*)    Platelets 469 (*)    Neutro Abs 8.5 (*)    All other components within normal limits  URINE MICROSCOPIC-ADD ON - Abnormal; Notable for the following:    Bacteria, UA MANY (*)    All other components within normal limits  URINE CULTURE  PREGNANCY, URINE  LIPASE, BLOOD   Results for orders placed during the hospital encounter of 05/07/13  PREGNANCY, URINE      Result Value Range   Preg Test, Ur NEGATIVE  NEGATIVE  URINALYSIS, ROUTINE W REFLEX MICROSCOPIC      Result Value Range   Color, Urine YELLOW  YELLOW   APPearance CLEAR  CLEAR   Specific Gravity, Urine 1.021  1.005 - 1.030   pH 6.0  5.0 - 8.0   Glucose, UA NEGATIVE  NEGATIVE mg/dL   Hgb urine dipstick SMALL (*) NEGATIVE   Bilirubin Urine NEGATIVE  NEGATIVE   Ketones, ur NEGATIVE  NEGATIVE mg/dL   Protein, ur NEGATIVE  NEGATIVE mg/dL   Urobilinogen, UA 0.2  0.0 - 1.0 mg/dL   Nitrite NEGATIVE  NEGATIVE   Leukocytes, UA NEGATIVE  NEGATIVE  COMPREHENSIVE METABOLIC PANEL      Result Value Range   Sodium 141  137 - 147 mEq/L   Potassium 4.1  3.7 - 5.3 mEq/L   Chloride 100  96 - 112 mEq/L   CO2 25  19 - 32 mEq/L   Glucose, Bld 95  70 - 99 mg/dL   BUN 12  6 - 23 mg/dL   Creatinine, Ser 0.70  0.50 - 1.10 mg/dL   Calcium 10.0  8.4 - 10.5 mg/dL   Total Protein 9.0 (*) 6.0 - 8.3 g/dL   Albumin 4.5  3.5 - 5.2 g/dL   AST 14  0 - 37 U/L   ALT 10  0 - 35 U/L   Alkaline Phosphatase 92  39 - 117 U/L   Total Bilirubin 0.5  0.3 - 1.2 mg/dL   GFR calc non Af Amer >90  >90 mL/min   GFR calc Af Amer  >90  >90 mL/min  LIPASE, BLOOD      Result Value Range   Lipase 22  11 - 59 U/L  CBC WITH DIFFERENTIAL      Result Value Range   WBC 11.6 (*) 4.0 - 10.5 K/uL   RBC 5.10  3.87 - 5.11 MIL/uL   Hemoglobin 14.8  12.0 - 15.0 g/dL   HCT 43.7  36.0 - 46.0 %   MCV 85.7  78.0 - 100.0 fL   MCH 29.0  26.0 - 34.0 pg   MCHC 33.9  30.0 - 36.0 g/dL   RDW 13.0  11.5 - 15.5 %   Platelets 469 (*) 150 - 400 K/uL   Neutrophils Relative % 73  43 - 77 %   Neutro Abs 8.5 (*) 1.7 - 7.7 K/uL   Lymphocytes Relative 19  12 - 46 %   Lymphs Abs 2.2  0.7 - 4.0 K/uL   Monocytes Relative 8  3 - 12 %   Monocytes Absolute 1.0  0.1 - 1.0 K/uL   Eosinophils Relative 0  0 - 5 %   Eosinophils Absolute 0.0  0.0 - 0.7 K/uL   Basophils Relative 0  0 - 1 %   Basophils Absolute 0.0  0.0 - 0.1 K/uL  URINE MICROSCOPIC-ADD ON      Result Value Range   Squamous Epithelial / LPF RARE  RARE   WBC, UA 3-6  <3 WBC/hpf   RBC / HPF 3-6  <3 RBC/hpf   Bacteria, UA MANY (*) RARE   Urine-Other MUCOUS PRESENT      Imaging Review Ct Abdomen Pelvis W Contrast  05/07/2013   CLINICAL DATA:  Nausea, abdominal pain, vomiting.  EXAM: CT ABDOMEN AND PELVIS WITH CONTRAST  TECHNIQUE: Multidetector CT imaging of the abdomen and pelvis was performed using the standard protocol following bolus administration of intravenous contrast.  CONTRAST:  74mL OMNIPAQUE IOHEXOL 300 MG/ML SOLN, 152mL OMNIPAQUE IOHEXOL 300 MG/ML SOLN  COMPARISON:  US ABDOMEN COMPLETE dated 11/25/2011  FINDINGS: Lung bases are clear. No effusions. Heart is normal size.  Liver, gallbladder, spleen, pancreas, adrenals and kidneys are normal. Uterus, adnexae and urinary bladder are normal.  Stomach, large and small bowel are unremarkable. Short normal appearing appendix is noted. No inflammatory process in the right lower quadrant. No free fluid, free air or adenopathy. Aorta is normal caliber.  No acute bony abnormality.  IMPRESSION: No acute findings in the abdomen or pelvis.    Electronically Signed   By: Rolm Baptise M.D.   On: 05/07/2013 13:26    EKG Interpretation   None       MDM   1. Abdominal pain   2. UTI (lower urinary tract infection)    The patient with several day history of of periumbilical Donald pain actually since Tuesday associated with nausea 5 episodes of vomiting last night 2-3 the day before no diarrhea. Last menstrual period was last week. Workup here for the abdominal pain without any significant findings other than perhaps an early urinary tract infection but not enough to explain the degree of pain. Patient improved here in the emergency department. Mild leukocytosis no liver function test abnormalities. Will treat with pain medicine treat with the antibiotics for early UTI and have the patient followup with her regular Dr. Also treat for the nausea.    Mervin Kung, MD 05/07/13 [id number]

## 2013-05-07 NOTE — Discharge Instructions (Signed)
CT scan negative for any significant findings. Urinalysis consistent with early urinary tract infection not sure causing all the symptoms but will treat with antibiotics. Take the Keflex as directed over the next 7 days. Take pain medicine as needed take antinausea medicine as needed. Work note provided. Return for any new or worse symptoms. If not in proved followup with your doctor in the next few days.

## 2013-05-07 NOTE — ED Notes (Signed)
Pt c/o nausea x 3 days with vomiting and lower abdominal pain. Denies fever, diarrhea.

## 2013-05-07 NOTE — ED Notes (Signed)
PO contrast at bedside.

## 2013-05-07 NOTE — ED Notes (Signed)
Pt is unable to void at present time. 

## 2013-05-08 LAB — URINE CULTURE: Colony Count: 9000

## 2014-03-29 ENCOUNTER — Telehealth: Payer: Self-pay

## 2014-03-29 NOTE — Telephone Encounter (Signed)
[facility] - Client [facility] Patient Name: Angela Jordan Gender: Female DOB: 1990/11/28 Age: 23 Y 86 M 24 D Return Phone Number: [phone] (Primary) Address: [address] City/State/Zip: [place] [address] Client [facility] - Client Client Site [place] - Day Physician Shanon Ace Contact Type Fax Call Type Triage / [place] Name Angela Jordan Relationship To Patient Self Return Phone Number [phone] (Primary) Chief Complaint Vomiting Initial Comment Caller states that she can't eat anything, feels sick PreDisposition Home Care Nurse Assessment Nurse: Butler Denmark Date/Time Eilene Ghazi Time): 03/28/2014 4:49:09 PM Confirm and document reason for call. If symptomatic, describe symptoms. ---Caller states that she can't eat anything, feels nausea and lack of energy; feeling bad since October. no fever; no diarrhea at present time but did have diarrhea past 2 days prior. Has the patient traveled out of the country within the last 30 days? ---No Does the patient require triage? ---Yes Related visit to physician within the last 2 weeks? ---No Does the PT have any chronic conditions? (i.e. diabetes, asthma, etc.) ---No Did the patient indicate they were pregnant? ---No Guidelines Guideline Title Affirmed Question Affirmed Notes Nurse Date/Time Eilene Ghazi Time) Nausea Unexplained nausea (all triage questions negative) Butler Denmark 03/28/2014 4:52:18 PM Disp. Time Eilene Ghazi Time) Disposition Final User 03/28/2014 4:57:32 PM Home Care Yes Butler Denmark Caller Understands: Yes Disagree/Comply: Comply Care Advice Given Per Guideline PLEASE NOTE: All timestamps contained within this report are represented as [place] Standard Time. CONFIDENTIALTY NOTICE: This fax transmission is intended only for the addressee. It contains information that is  legally privileged, confidential or otherwise protected from use or disclosure. If you are not the intended recipient, you are strictly prohibited from reviewing, disclosing, copying using or disseminating any of this information or taking any action in reliance on or regarding this information. If you have received this fax in error, please notify us immediately by telephone so that we can arrange for its return to [place]. Phone: [phone], Toll-Free: [phone], Fax: [phone] Page: 2 of 2 Call Id: [id number] Care Advice Given Per Guideline HOME CARE: You should be able to treat this at home. REASSURANCE: Nausea is often caused by a stomach virus or indigestion (e.g., from overeating, alcohol). Nausea usually passes in 1 or 2 days. If nausea is your only symptom, it's usually not caused by anything serious. CLEAR FLUIDS - Take clear fluids in small amounts until the nausea is resolved for 8 hours: * Sip water or rehydration liquid (Gatorade or Powerade) * Other options: 1/2 strength flat lemon-lime soda or ginger ale SOLIDS: Gradually return to a normal diet. Start with saltine crackers, white bread, rice, mashed potatoes, cereal, apple sauce etc. AVOID MEDS: * Stop taking all non-prescription medicines. (Reason: may make nausea worse.) * Avoid NSAIDs, which can cause gastritis CALL BACK IF: * Nausea lasts over 1 week * You become worse. CARE ADVICE given per Nausea (Adult) guideline.

## 2014-03-29 NOTE — Telephone Encounter (Signed)
Noted.  Pt to call back if she becomes worse or not getting better.

## 2014-04-18 ENCOUNTER — Ambulatory Visit (INDEPENDENT_AMBULATORY_CARE_PROVIDER_SITE_OTHER): Payer: BLUE CROSS/BLUE SHIELD | Admitting: Obstetrics and Gynecology

## 2014-04-18 ENCOUNTER — Encounter: Payer: Self-pay | Admitting: Obstetrics and Gynecology

## 2014-04-18 VITALS — BP 100/64 | HR 68 | Resp 16 | Ht 62.75 in | Wt 168.0 lb

## 2014-04-18 DIAGNOSIS — Z0000 Encounter for general adult medical examination without abnormal findings: Secondary | ICD-10-CM

## 2014-04-18 DIAGNOSIS — Z708 Other sex counseling: Secondary | ICD-10-CM

## 2014-04-18 DIAGNOSIS — Z01419 Encounter for gynecological examination (general) (routine) without abnormal findings: Secondary | ICD-10-CM

## 2014-04-18 DIAGNOSIS — L989 Disorder of the skin and subcutaneous tissue, unspecified: Secondary | ICD-10-CM

## 2014-04-18 DIAGNOSIS — Z309 Encounter for contraceptive management, unspecified: Secondary | ICD-10-CM

## 2014-04-18 LAB — COMPREHENSIVE METABOLIC PANEL
ALBUMIN: 4.5 g/dL (ref 3.5–5.2)
ALT: 12 U/L (ref 0–35)
AST: 14 U/L (ref 0–37)
Alkaline Phosphatase: 84 U/L (ref 39–117)
BUN: 10 mg/dL (ref 6–23)
CO2: 27 meq/L (ref 19–32)
Calcium: 9.7 mg/dL (ref 8.4–10.5)
Chloride: 105 mEq/L (ref 96–112)
Creat: 0.63 mg/dL (ref 0.50–1.10)
Glucose, Bld: 88 mg/dL (ref 70–99)
Potassium: 4.2 mEq/L (ref 3.5–5.3)
SODIUM: 140 meq/L (ref 135–145)
Total Bilirubin: 0.3 mg/dL (ref 0.2–1.2)
Total Protein: 7.4 g/dL (ref 6.0–8.3)

## 2014-04-18 LAB — LIPID PANEL
Cholesterol: 159 mg/dL (ref 0–200)
HDL: 39 mg/dL — AB (ref >39)
LDL CALC: 103 mg/dL — AB (ref 0–99)
Total CHOL/HDL Ratio: 4.1 Ratio
Triglycerides: 85 mg/dL (ref <150)
VLDL: 17 mg/dL (ref 0–40)

## 2014-04-18 LAB — CBC
HEMATOCRIT: 41.3 % (ref 36.0–46.0)
Hemoglobin: 14 g/dL (ref 12.0–15.0)
MCH: 29.5 pg (ref 26.0–34.0)
MCHC: 33.9 g/dL (ref 30.0–36.0)
MCV: 86.9 fL (ref 78.0–100.0)
MPV: 9.4 fL (ref 8.6–12.4)
Platelets: 415 10*3/uL — ABNORMAL HIGH (ref 150–400)
RBC: 4.75 MIL/uL (ref 3.87–5.11)
RDW: 13.6 % (ref 11.5–15.5)
WBC: 11.1 10*3/uL — ABNORMAL HIGH (ref 4.0–10.5)

## 2014-04-18 NOTE — Patient Instructions (Addendum)
Etonogestrel implant What is this medicine? ETONOGESTREL (et oh noe JES trel) is a contraceptive (birth control) device. It is used to prevent pregnancy. It can be used for up to 3 years. This medicine may be used for other purposes; ask your health care provider or pharmacist if you have questions. COMMON BRAND NAME(S): Implanon, Nexplanon What should I tell my health care provider before I take this medicine? They need to know if you have any of these conditions: -abnormal vaginal bleeding -blood vessel disease or blood clots -cancer of the breast, cervix, or liver -depression -diabetes -gallbladder disease -headaches -heart disease or recent heart attack -high blood pressure -high cholesterol -kidney disease -liver disease -renal disease -seizures -tobacco smoker -an unusual or allergic reaction to etonogestrel, other hormones, anesthetics or antiseptics, medicines, foods, dyes, or preservatives -pregnant or trying to get pregnant -breast-feeding How should I use this medicine? This device is inserted just under the skin on the inner side of your upper arm by a health care professional. Talk to your pediatrician regarding the use of this medicine in children. Special care may be needed. Overdosage: If you think you've taken too much of this medicine contact a poison control center or emergency room at once. Overdosage: If you think you have taken too much of this medicine contact a poison control center or emergency room at once. NOTE: This medicine is only for you. Do not share this medicine with others. What if I miss a dose? This does not apply. What may interact with this medicine? Do not take this medicine with any of the following medications: -amprenavir -bosentan -fosamprenavir This medicine may also interact with the following medications: -barbiturate medicines for inducing sleep or treating seizures -certain medicines for fungal infections like ketoconazole and  itraconazole -griseofulvin -medicines to treat seizures like carbamazepine, felbamate, oxcarbazepine, phenytoin, topiramate -modafinil -phenylbutazone -rifampin -some medicines to treat HIV infection like atazanavir, indinavir, lopinavir, nelfinavir, tipranavir, ritonavir -St. John's wort This list may not describe all possible interactions. Give your health care provider a list of all the medicines, herbs, non-prescription drugs, or dietary supplements you use. Also tell them if you smoke, drink alcohol, or use illegal drugs. Some items may interact with your medicine. What should I watch for while using this medicine? This product does not protect you against HIV infection (AIDS) or other sexually transmitted diseases. You should be able to feel the implant by pressing your fingertips over the skin where it was inserted. Tell your doctor if you cannot feel the implant. What side effects may I notice from receiving this medicine? Side effects that you should report to your doctor or health care professional as soon as possible: -allergic reactions like skin rash, itching or hives, swelling of the face, lips, or tongue -breast lumps -changes in vision -confusion, trouble speaking or understanding -dark urine -depressed mood -general ill feeling or flu-like symptoms -light-colored stools -loss of appetite, nausea -right upper belly pain -severe headaches -severe pain, swelling, or tenderness in the abdomen -shortness of breath, chest pain, swelling in a leg -signs of pregnancy -sudden numbness or weakness of the face, arm or leg -trouble walking, dizziness, loss of balance or coordination -unusual vaginal bleeding, discharge -unusually weak or tired -yellowing of the eyes or skin Side effects that usually do not require medical attention (Report these to your doctor or health care professional if they continue or are bothersome.): -acne -breast pain -changes in  weight -cough -fever or chills -headache -irregular menstrual bleeding -itching, burning, and   vaginal discharge -pain or difficulty passing urine -sore throat This list may not describe all possible side effects. Call your doctor for medical advice about side effects. You may report side effects to FDA at 1-800-FDA-1088. Where should I keep my medicine? This drug is given in a hospital or clinic and will not be stored at home. NOTE: This sheet is a summary. It may not cover all possible information. If you have questions about this medicine, talk to your doctor, pharmacist, or health care provider.  2015, Elsevier/Gold Standard. (2011-09-30 15:37:45)  EXERCISE AND DIET:  We recommended that you start or continue a regular exercise program for good health. Regular exercise means any activity that makes your heart beat faster and makes you sweat.  We recommend exercising at least 30 minutes per day at least 3 days a week, preferably 4 or 5.  We also recommend a diet low in fat and sugar.  Inactivity, poor dietary choices and obesity can cause diabetes, heart attack, stroke, and kidney damage, among others.    ALCOHOL AND SMOKING:  Women should limit their alcohol intake to no more than 7 drinks/beers/glasses of wine (combined, not each!) per week. Moderation of alcohol intake to this level decreases your risk of breast cancer and liver damage. And of course, no recreational drugs are part of a healthy lifestyle.  And absolutely no smoking or even second hand smoke. Most people know smoking can cause heart and lung diseases, but did you know it also contributes to weakening of your bones? Aging of your skin?  Yellowing of your teeth and nails?  CALCIUM AND VITAMIN D:  Adequate intake of calcium and Vitamin D are recommended.  The recommendations for exact amounts of these supplements seem to change often, but generally speaking 600 mg of calcium (either carbonate or citrate) and 800 units of Vitamin  D per day seems prudent. Certain women may benefit from higher intake of Vitamin D.  If you are among these women, your doctor will have told you during your visit.    PAP SMEARS:  Pap smears, to check for cervical cancer or precancers,  have traditionally been done yearly, although recent scientific advances have shown that most women can have pap smears less often.  However, every woman still should have a physical exam from her gynecologist every year. It will include a breast check, inspection of the vulva and vagina to check for abnormal growths or skin changes, a visual exam of the cervix, and then an exam to evaluate the size and shape of the uterus and ovaries.  And after 24 years of age, a rectal exam is indicated to check for rectal cancers. We will also provide age appropriate advice regarding health maintenance, like when you should have certain vaccines, screening for sexually transmitted diseases, bone density testing, colonoscopy, mammograms, etc.   MAMMOGRAMS:  All women over 21 years old should have a yearly mammogram. Many facilities now offer a "3D" mammogram, which may cost around $50 extra out of pocket. If possible,  we recommend you accept the option to have the 3D mammogram performed.  It both reduces the number of women who will be called back for extra views which then turn out to be normal, and it is better than the routine mammogram at detecting truly abnormal areas.    COLONOSCOPY:  Colonoscopy to screen for colon cancer is recommended for all women at age 68.  We know, you hate the idea of the prep.  We agree,  BUT, having colon cancer and not knowing it is worse!!  Colon cancer so often starts as a polyp that can be seen and removed at colonscopy, which can quite literally save your life!  And if your first colonoscopy is normal and you have no family history of colon cancer, most women don't have to have it again for 10 years.  Once every ten years, you can do something that may  end up saving your life, right?  We will be happy to help you get it scheduled when you are ready.  Be sure to check your insurance coverage so you understand how much it will cost.  It may be covered as a preventative service at no cost, but you should check your particular policy.

## 2014-04-18 NOTE — Progress Notes (Signed)
24 y.o. No pregnancy history. SingleCaucasianF here for annual exam.    Interested in [place].  Difficulty remembering to take OCPs.  Declines an IUD.  Wants STD testing and blood work.   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   Patient's last menstrual period was 04/13/2014.          Sexually active: Yes.    The current method of family planning is condoms all the time.    Exercising: No.  exercise Smoker:  no  Health Maintenance: Pap:  none History of abnormal Pap:  no MMG:  none Colonoscopy:  none BMD:   none TDaP:  ? Screening Labs: none, Hb today: 13.4, Urine today: neg Self breast exam: not done   reports that she has never smoked. She does not have any smokeless tobacco history on file. She reports that she does not drink alcohol or use illicit drugs.  Past Medical History  Diagnosis Date  . Constipation   . CONSTIPATION, CHRONIC 06/04/2007    Qualifier: Diagnosis of  By: Sherren Mocha MD, Jory Ee   . Headache(784.0) 02/26/2010    Qualifier: Diagnosis of  By: Regis Bill MD, Standley Brooking   . Irregular menses     Dr Helane Rima  . Blood transfusion without reported diagnosis     at birth  . Dysmenorrhea     Past Surgical History  Procedure Laterality Date  . Wisdom tooth extraction      No current outpatient prescriptions on file.   No current facility-administered medications for this visit.    Family History  Problem Relation Age of Onset  . Diabetes type II      ROS:  Pertinent items are noted in HPI.  Otherwise, a comprehensive ROS was negative.  Exam:   BP 100/64 mmHg  Pulse 68  Resp 16  Ht 5' 2.75" (1.594 m)  Wt 168 lb (76.204 kg)  BMI 29.99 kg/m2  LMP 04/13/2014     Height: 5' 2.75" (159.4 cm)  Ht Readings from Last 3 Encounters:  04/18/14 5' 2.75" (1.594 m)  05/07/13 5\' 3"  (1.6 m)  01/28/12 5\' 3"  (1.6 m)    General appearance: alert, cooperative and appears stated age Head: Normocephalic, without  obvious abnormality, atraumatic Neck: no adenopathy, supple, symmetrical, trachea midline and thyroid normal to inspection and palpation Lungs: clear to auscultation bilaterally Breasts: normal appearance, no masses or tenderness, Inspection negative, No nipple retraction or dimpling, No nipple discharge or bleeding, No axillary or supraclavicular adenopathy Heart: regular rate and rhythm Abdomen: soft, non-tender; bowel sounds normal; no masses,  no organomegaly Extremities: extremities normal, atraumatic, no cyanosis or edema Skin: Skin color, texture, turgor normal. No rashes or lesions Lymph nodes: Cervical, supraclavicular, and axillary nodes normal. No abnormal inguinal nodes palpated Neurologic: Grossly normal   Pelvic: External genitalia:  no lesions              Urethra:  normal appearing urethra with no masses, tenderness or lesions              Bartholins and Skenes: normal                 Vagina: normal appearing vagina with normal color and discharge, no lesions              Cervix: no lesions              Pap taken: Yes.   Bimanual Exam:  Uterus:  normal size, contour, position, consistency, mobility, non-tender              Adnexa: normal adnexa and no mass, fullness, tenderness               Rectovaginal: Confirms.  Stool in the vault.                Anus:  normal sphincter tone, no lesions  Chaperone was present for exam.  A:  Well Woman with normal exam Need for reliable contraception.  Need for STD testing.  Skin lesions.   P:   Mammogram age 25.  Reviewed self breast exam.  Discussion regarding contraceptive choices.  Return for Nexplanon with menses.   pap smear done.  STD testing.  Cholesterol, CMP, CBC. Referral to Dermatology.  Additional 10 minutes discussing options for contraception including Nexplanon, Skyla, ParaGard, Nuva Ring and Ortho Evra.   Over 50 % was spent in counseling.   return annually or prn

## 2014-04-19 ENCOUNTER — Other Ambulatory Visit: Payer: Self-pay | Admitting: Obstetrics and Gynecology

## 2014-04-19 DIAGNOSIS — D72829 Elevated white blood cell count, unspecified: Secondary | ICD-10-CM

## 2014-04-19 LAB — STD PANEL
HEP B S AG: NEGATIVE
HIV 1&2 Ab, 4th Generation: NONREACTIVE

## 2014-04-19 LAB — HEMOGLOBIN, FINGERSTICK: HEMOGLOBIN, FINGERSTICK: 13.4 g/dL (ref 12.0–16.0)

## 2014-04-19 LAB — HEPATITIS C ANTIBODY: HCV AB: NEGATIVE

## 2014-04-20 ENCOUNTER — Telehealth: Payer: Self-pay

## 2014-04-20 LAB — IPS N GONORRHOEA AND CHLAMYDIA BY PCR

## 2014-04-20 LAB — IPS PAP SMEAR ONLY

## 2014-04-20 NOTE — Telephone Encounter (Signed)
Spoke with patient. Results given. Patient is agreeable and verbalizes understanding. Patient is in school and would like to call back to schedule follow up appointment around school schedule.  Routing to provider for final review. Patient agreeable to disposition. Will close encounter

## 2014-04-20 NOTE — Telephone Encounter (Signed)
-----   Message from [place], MD sent at 04/19/2014  6:24 PM EST ----- Please inform patient of her lab results.  Mildly elevated WBC and platelets, but improved from one year ago.  Recheck in 3 months.  Please make lab visit.   Cholesterol panel was OK, but I would like to see the patient increase her exercise.  The STD testing was negative for HIV, syphilis, Hep B and Hep C.   GC/CT are pending.   Cc- Angela Jordan

## 2014-04-20 NOTE — Telephone Encounter (Signed)
Left message to call Kaitlyn at [phone]. 

## 2014-04-22 ENCOUNTER — Telehealth: Payer: Self-pay | Admitting: Obstetrics and Gynecology

## 2014-04-22 NOTE — Addendum Note (Signed)
Addended by: Tacy Learn, Taesha Goodell E on: 04/22/2014 05:28 PM   Modules accepted: Orders

## 2014-04-22 NOTE — Telephone Encounter (Signed)
Left message for patient to call back. Need to go over benefits for nexplanon. Pr $0

## 2014-04-26 LAB — IPS HPV ON A LIQUID BASED SPECIMEN

## 2014-04-27 ENCOUNTER — Telehealth: Payer: Self-pay

## 2014-04-27 NOTE — Telephone Encounter (Signed)
Pt returning call

## 2014-04-27 NOTE — Telephone Encounter (Signed)
Called patient at [phone] to discuss pap smear results, LMOVM to call me back.

## 2014-04-27 NOTE — Telephone Encounter (Signed)
-----   Message from [place], MD sent at 04/26/2014  6:33 PM EST ----- Please report ASCUS (atypical cells of undetermined significance) pap and negative HR HPV to patient.  No further evaluation or treatment is needed due to the negative HR HPV status.  GC/CT are negative.  Recall - 02.

## 2014-04-28 NOTE — Telephone Encounter (Signed)
Patient notified of results--see phone note of 04-28-14.

## 2014-06-09 ENCOUNTER — Telehealth: Payer: Self-pay | Admitting: Obstetrics and Gynecology

## 2014-06-09 NOTE — Telephone Encounter (Signed)
Spoke with patient. Patient states that she started her cycle yesterday 3/2. "I am having some intense cramping with really bad nausea. This happens every month and I cant eat for the first day of my cycle. I tried to eat last night and I have been throwing up since. The cramping is making me really nauseous." Patient states that this has been happening with her cycle for many months now. "It only happens with my period." Denies any fevers, chills, sore throat, diarrhea, etc. "I am having some lower back pain but that is normal for me with my cycle." Denies any urinary symptoms. Is not currently on any form of birth control. Patient is requesting appointment with Dr.Silva for Monday 3/7. Appointment scheduled for 3/7 at 11:30am. Patient is agreeable to date and time. Advised patient will need to drink plenty of fluids to stay hydrated. If cramping, nausea, and vomiting worsen or continue will need to be seen sooner with our office or local urgent care. Patient is agreeable and verbalizes understanding.  Routing to provider for final review. Patient agreeable to disposition. Will close encounter

## 2014-06-09 NOTE — Telephone Encounter (Signed)
Returning call.

## 2014-06-09 NOTE — Telephone Encounter (Signed)
Spoke with patient. Advised patient of message as seen below from [place]. Patient states "I do not like to take Motrin because it makes me nauseous even with food. I am taking pamprin and it helps." Advised patient if she changes her mind or needs anything to give [place] a call. Patient is agreeable.  Encounter previously closed.

## 2014-06-09 NOTE — Telephone Encounter (Signed)
Left message to call Kaitlyn at [phone]. 

## 2014-06-09 NOTE — Telephone Encounter (Signed)
Please contact patient back to offer rx for Motrin 800 mg po q 8 hours for pain. #30, RF 2. Take with food to avoid gastritis.  I will be happy to help the patient with her peroids!

## 2014-06-09 NOTE — Telephone Encounter (Addendum)
Patient is having nausea with cramping. Patient is asking to see Dr.Silva. Last seen 04/18/14.

## 2014-06-13 ENCOUNTER — Ambulatory Visit (INDEPENDENT_AMBULATORY_CARE_PROVIDER_SITE_OTHER): Payer: BLUE CROSS/BLUE SHIELD | Admitting: Obstetrics and Gynecology

## 2014-06-13 ENCOUNTER — Encounter: Payer: Self-pay | Admitting: Obstetrics and Gynecology

## 2014-06-13 VITALS — BP 110/60 | HR 60 | Ht 62.75 in | Wt 167.0 lb

## 2014-06-13 DIAGNOSIS — Z308 Encounter for other contraceptive management: Secondary | ICD-10-CM | POA: Diagnosis not present

## 2014-06-13 DIAGNOSIS — N946 Dysmenorrhea, unspecified: Secondary | ICD-10-CM | POA: Diagnosis not present

## 2014-06-13 NOTE — Progress Notes (Signed)
Patient ID: Angela Jordan, female   DOB: [birth year], 24 y.o.   MRN: [id number] GYNECOLOGY VISIT  HPI: 24 y.o.   Single  Caucasian  female   [id number] with Patient's last menstrual period was 06/08/2014 (exact date).   here for evaluation of heavy cycles and nausea with menses. Menses are monthly and regular.  Painful cramping and Pamprin helps.   Wants to try the Nexplanon.   Used birth control pills in the past and is worried about taking them on time with her busy schedule.  GYNECOLOGIC HISTORY: Patient's last menstrual period was 06/08/2014 (exact date). Sexually active:  yes Partner preference: female Contraception:  condoms Menopausal hormone therapy: n/a DES exposure:  no  Blood transfusions:  Yes, at birth--pt. premature Sexually transmitted diseases:  no  GYN procedures and prior surgeries:  no Last mammogram:   n/a              Last pap and high risk HPV testing: 04-18-14 ASCUS, negative HR HPV. History of abnormal pap smear:  no   OB History    Gravida Para Term Preterm AB TAB SAB Ectopic Multiple Living   0 0 0 0 0 0 0 0 0 0        LIFESTYLE: Exercise:               Tobacco: no Alcohol:  No Drug use:  no  Patient Active Problem List   Diagnosis Date Noted  . Screening-pulmonary TB 02/06/2012  . 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 Visit for preventive health examination 02/01/2012  . PPD screening test 02/01/2012    Past Medical History  Diagnosis Date  . Constipation   . CONSTIPATION, CHRONIC 06/04/2007    Qualifier: Diagnosis of  By: Sherren Mocha MD, Jory Ee   . Headache(784.0) 02/26/2010    Qualifier: Diagnosis of  By: Regis Bill MD, Standley Brooking   . Irregular menses     Dr Helane Rima  . Blood transfusion without reported diagnosis     at birth  . Dysmenorrhea     Past Surgical History  Procedure Laterality Date  . Wisdom tooth extraction      No current outpatient prescriptions on file.   No current facility-administered medications for this visit.      ALLERGIES: Review of patient's allergies indicates no known allergies.  Family History  Problem Relation Age of Onset  . Hypertension Father   . Diabetes Maternal Aunt   . Thyroid disease Paternal Aunt   . Diabetes Maternal Grandmother   . Heart disease Maternal Grandmother   . Leukemia Maternal Grandfather   . Breast cancer Paternal Grandmother   . Lymphoma Paternal Grandmother     History   Social History  . Marital Status: Single    Spouse Name: N/A  . Number of Children: N/A  . Years of Education: N/A   Occupational History  . Not on file.   Social History Main Topics  . Smoking status: Never Smoker   . Smokeless tobacco: Not on file  . Alcohol Use: No  . Drug Use: No  . Sexual Activity:    Partners: Male    Birth Control/ Protection: Condom   Other Topics Concern  . Not on file   Social History Narrative   hh of 4  With family   No pets   Went to [employer]  Going to [place] smokes.   But neg ets     ROS:  Pertinent items are noted in  HPI.  PHYSICAL EXAMINATION:    BP 110/60 mmHg  Pulse 60  Ht 5' 2.75" (1.594 m)  Wt 167 lb (75.751 kg)  BMI 29.81 kg/m2  LMP 06/08/2014 (Exact Date)   Wt Readings from Last 3 Encounters:  06/13/14 167 lb (75.751 kg)  04/18/14 168 lb (76.204 kg)  05/07/13 160 lb (72.576 kg)     Ht Readings from Last 3 Encounters:  06/13/14 5' 2.75" (1.594 m)  04/18/14 5' 2.75" (1.594 m)  05/07/13 5\' 3"  (1.6 m)    General appearance: alert, cooperative and appears stated age  ASSESSMENT  Dysmenorrhea.  Desire for Neplanon. Mild leukopenia and thrombocytosis.   PLAN  Discussed Nexplanon risks and benefits.  Will proceed with precerting and return for placement during first 5 days of cycle.  Will check CBC with differential when she returns for Nexplanon placement.   Pamprin during the first 48 hours of cycle to try and minimize pain.  Take first Pamprin at the very onset of menses.  15 minutes face to face  time of which over 50% was spent in counseling.   An After Visit Summary was printed and given to the patient.

## 2014-06-17 ENCOUNTER — Telehealth: Payer: Self-pay | Admitting: Obstetrics and Gynecology

## 2014-06-17 NOTE — Telephone Encounter (Signed)
Patients phone does not accept incoming calls.

## 2014-08-17 ENCOUNTER — Telehealth: Payer: Self-pay | Admitting: Internal Medicine

## 2014-08-17 NOTE — Telephone Encounter (Signed)
Please use next 30 minute slot.  Sx ongoing for several months.  Not emergent.  Also have her to talk to triage once you have her schedule.  Thanks!

## 2014-08-17 NOTE — Telephone Encounter (Signed)
Pt has ongoing stomach issues, unable to eat. For at least 4 months.  Everytime she eats, she has to go the bathroom.  Pt will feel nauseous and unable to finish her meal when she eats.  Pt not seen since 01/2012. Will probably need 30 min?  pls advise on when to schedule.  Pt wants to see you asap.

## 2014-08-18 ENCOUNTER — Telehealth: Payer: Self-pay | Admitting: Internal Medicine

## 2014-08-18 NOTE — Telephone Encounter (Signed)
[facility] - Client [place] Patient Name: Angela Jordan DOB: [birth year] Initial Comment Caller states has ongoing stomach issues, nausea, and diarrhea. Abdominal pain. Nurse Assessment Nurse: Donalynn Furlong, RN, Myna Hidalgo Date/Time Eilene Ghazi Time): 08/18/2014 8:55:26 AM Confirm and document reason for call. If symptomatic, describe symptoms. ---Caller states has ongoing stomach issues, nausea, and diarrhea. Abdominal pain.Afebrile, no vomiting Has the patient traveled out of the country within the last 30 days? ---No Does the patient require triage? ---Yes Related visit to physician within the last 2 weeks? ---No Does the PT have any chronic conditions? (i.e. diabetes, asthma, etc.) ---No Did the patient indicate they were pregnant? ---No Guidelines Guideline Title Affirmed Question Affirmed Notes Final Disposition User Comments appt scheduled 5/13 with Dr Maudie Mercury at 9:15, Callback to pt to make aware

## 2014-08-18 NOTE — Telephone Encounter (Signed)
Called [facility] and asked them to contact the patient. Appt not scheduled as to not conflict with what advice patient is given via [place].

## 2014-08-19 ENCOUNTER — Encounter: Payer: Self-pay | Admitting: Family Medicine

## 2014-08-19 ENCOUNTER — Telehealth: Payer: Self-pay | Admitting: Obstetrics and Gynecology

## 2014-08-19 ENCOUNTER — Ambulatory Visit (INDEPENDENT_AMBULATORY_CARE_PROVIDER_SITE_OTHER): Payer: BLUE CROSS/BLUE SHIELD | Admitting: Family Medicine

## 2014-08-19 VITALS — BP 108/72 | HR 73 | Temp 98.2°F | Ht 62.75 in | Wt 166.3 lb

## 2014-08-19 DIAGNOSIS — D473 Essential (hemorrhagic) thrombocythemia: Secondary | ICD-10-CM

## 2014-08-19 DIAGNOSIS — D72829 Elevated white blood cell count, unspecified: Secondary | ICD-10-CM | POA: Diagnosis not present

## 2014-08-19 DIAGNOSIS — N926 Irregular menstruation, unspecified: Secondary | ICD-10-CM | POA: Diagnosis not present

## 2014-08-19 DIAGNOSIS — N946 Dysmenorrhea, unspecified: Secondary | ICD-10-CM

## 2014-08-19 DIAGNOSIS — R7989 Other specified abnormal findings of blood chemistry: Secondary | ICD-10-CM

## 2014-08-19 LAB — CBC WITH DIFFERENTIAL/PLATELET
Basophils Absolute: 0 10*3/uL (ref 0.0–0.1)
Basophils Relative: 0.4 % (ref 0.0–3.0)
Eosinophils Absolute: 0 10*3/uL (ref 0.0–0.7)
Eosinophils Relative: 0.4 % (ref 0.0–5.0)
HEMATOCRIT: 39.8 % (ref 36.0–46.0)
Hemoglobin: 13.8 g/dL (ref 12.0–15.0)
Lymphocytes Relative: 20.9 % (ref 12.0–46.0)
Lymphs Abs: 1.8 10*3/uL (ref 0.7–4.0)
MCHC: 34.7 g/dL (ref 30.0–36.0)
MCV: 87 fl (ref 78.0–100.0)
Monocytes Absolute: 0.6 10*3/uL (ref 0.1–1.0)
Monocytes Relative: 6.6 % (ref 3.0–12.0)
Neutro Abs: 6 10*3/uL (ref 1.4–7.7)
Neutrophils Relative %: 71.7 % (ref 43.0–77.0)
PLATELETS: 387 10*3/uL (ref 150.0–400.0)
RBC: 4.58 Mil/uL (ref 3.87–5.11)
RDW: 13.6 % (ref 11.5–15.5)
WBC: 8.4 10*3/uL (ref 4.0–10.5)

## 2014-08-19 LAB — COMPREHENSIVE METABOLIC PANEL
ALT: 10 U/L (ref 0–35)
AST: 13 U/L (ref 0–37)
Albumin: 4.5 g/dL (ref 3.5–5.2)
Alkaline Phosphatase: 73 U/L (ref 39–117)
BUN: 10 mg/dL (ref 6–23)
CO2: 28 mEq/L (ref 19–32)
CREATININE: 0.72 mg/dL (ref 0.40–1.20)
Calcium: 9.9 mg/dL (ref 8.4–10.5)
Chloride: 103 mEq/L (ref 96–112)
GFR: 106.03 mL/min (ref >60.00)
GLUCOSE: 77 mg/dL (ref 70–99)
POTASSIUM: 3.9 meq/L (ref 3.5–5.1)
Sodium: 137 mEq/L (ref 135–145)
Total Bilirubin: 0.3 mg/dL (ref 0.2–1.2)
Total Protein: 8.1 g/dL (ref 6.0–8.3)

## 2014-08-19 LAB — TSH: TSH: 1.45 u[IU]/mL (ref 0.35–4.50)

## 2014-08-19 NOTE — Progress Notes (Signed)
HPI:  Angela Jordan is a 24 yo patient of Dr. Regis Bill here for an acute visit for:  Nausea: -started a few months ago, on and off -occurs usually in relationship to her periods, has some nausea, and urgency/cramping after meals and needs to go to have a BM(sometimes loose) - feels better after BM  -feels entirely normal between episodes without any symptoms -saw gyn for this in 04/2014 and 06/2014 with dx w/ dysmenorrhea and was going to get nexplanon, she has had a chronically elevated WBC and platelets for 8 years followed by gyn -denies: weight loss, blood in stools, melena, vomiting, fevers, malaise, vaginal symptoms, dysuria -she has always had irregular periods, dysmenorrhea -FDLMP: April 26th -denies FH GI disorders ROS: See pertinent positives and negatives per HPI.  Past Medical History  Diagnosis Date  . Constipation   . CONSTIPATION, CHRONIC 06/04/2007    Qualifier: Diagnosis of  By: Sherren Mocha MD, Jory Ee   . Headache(784.0) 02/26/2010    Qualifier: Diagnosis of  By: Regis Bill MD, Standley Brooking   . Irregular menses     Dr Helane Rima  . Blood transfusion without reported diagnosis     at birth  . Dysmenorrhea     Past Surgical History  Procedure Laterality Date  . Wisdom tooth extraction      Family History  Problem Relation Age of Onset  . Hypertension Father   . Diabetes Maternal Aunt   . Thyroid disease Paternal Aunt   . Diabetes Maternal Grandmother   . Heart disease Maternal Grandmother   . Leukemia Maternal Grandfather   . Breast cancer Paternal Grandmother   . Lymphoma Paternal Grandmother     History   Social History  . Marital Status: Single    Spouse Name: N/A  . Number of Children: N/A  . Years of Education: N/A   Social History Main Topics  . Smoking status: Never Smoker   . Smokeless tobacco: Not on file  . Alcohol Use: No  . Drug Use: No  . Sexual Activity:    Partners: Male    Birth Control/ Protection: Condom   Other Topics Concern  . None    Social History Narrative   hh of 4  With family   No pets   Went to [employer]  Going to [place] smokes.   But neg ets     No current outpatient prescriptions on file.  EXAM:  Filed Vitals:   08/19/14 0925  BP: 108/72  Pulse: 73  Temp: 98.2 F (36.8 C)    Body mass index is 29.69 kg/(m^2).  GENERAL: vitals reviewed and listed above, alert, oriented, appears well hydrated and in no acute distress  HEENT: atraumatic, conjunttiva clear, no obvious abnormalities on inspection of external nose and ears  NECK: no obvious masses on inspection  LUNGS: clear to auscultation bilaterally, no wheezes, rales or rhonchi, good air movement  CV: HRRR, no peripheral edema  ABD: BS+, soft, NTTP  MS: moves all extremities without noticeable abnormality  PSYCH: pleasant and cooperative, no obvious depression or anxiety  ASSESSMENT AND PLAN:  Discussed the following assessment and plan:  Dysmenorrhea - Plan: TSH, CBC with Differential, CMP  Elevated white blood cell count  Elevated platelet count - Plan: CBC with Differential  Irregular menstrual bleeding  -symptoms most c/w dysmenorrhea and advise tx with gyn as planned - nsaids and nexplanon  -benign exam today -labs today and follow up with PCP to review, pursue further  workup if abnormal or symptoms not improving -Patient advised to return or notify a doctor immediately if symptoms worsen or persist or new concerns arise.  There are no Patient Instructions on file for this visit.   Colin Benton R.

## 2014-08-19 NOTE — Telephone Encounter (Signed)
Patient calling to check on the status of her Nexplanon benefits.

## 2014-08-19 NOTE — Progress Notes (Signed)
Pre visit review using our clinic review tool, if applicable. No additional management support is needed unless otherwise documented below in the visit note. 

## 2014-08-19 NOTE — Telephone Encounter (Signed)
Spoke with patient. Advised of benefit quote received. Patient is to call within the first 5 days of cycle for insertion. Patient agreeable.

## 2014-08-22 ENCOUNTER — Telehealth: Payer: Self-pay | Admitting: Obstetrics and Gynecology

## 2014-08-22 ENCOUNTER — Ambulatory Visit (INDEPENDENT_AMBULATORY_CARE_PROVIDER_SITE_OTHER): Payer: BLUE CROSS/BLUE SHIELD | Admitting: Certified Nurse Midwife

## 2014-08-22 ENCOUNTER — Encounter: Payer: Self-pay | Admitting: Certified Nurse Midwife

## 2014-08-22 VITALS — BP 92/60 | HR 68 | Temp 98.1°F | Resp 16 | Ht 62.75 in | Wt 165.0 lb

## 2014-08-22 DIAGNOSIS — A499 Bacterial infection, unspecified: Secondary | ICD-10-CM | POA: Diagnosis not present

## 2014-08-22 DIAGNOSIS — R350 Frequency of micturition: Secondary | ICD-10-CM

## 2014-08-22 DIAGNOSIS — Z113 Encounter for screening for infections with a predominantly sexual mode of transmission: Secondary | ICD-10-CM

## 2014-08-22 DIAGNOSIS — B3731 Acute candidiasis of vulva and vagina: Secondary | ICD-10-CM

## 2014-08-22 DIAGNOSIS — B9689 Other specified bacterial agents as the cause of diseases classified elsewhere: Secondary | ICD-10-CM

## 2014-08-22 DIAGNOSIS — N760 Acute vaginitis: Secondary | ICD-10-CM

## 2014-08-22 DIAGNOSIS — B373 Candidiasis of vulva and vagina: Secondary | ICD-10-CM | POA: Diagnosis not present

## 2014-08-22 LAB — POCT URINALYSIS DIPSTICK
LEUKOCYTES UA: NEGATIVE
Urobilinogen, UA: NEGATIVE
pH, UA: 5

## 2014-08-22 MED ORDER — METRONIDAZOLE 0.75 % VA GEL: 1.0000 | Freq: Two times a day (BID) | VAGINAL | Status: DC

## 2014-08-22 MED ORDER — FLUCONAZOLE 150 MG PO TABS: 150.0000 mg | ORAL_TABLET | Freq: Once | ORAL | Status: DC

## 2014-08-22 NOTE — Telephone Encounter (Signed)
Spoke with patient. Patient states that she has been experiencing vaginal swelling and itching since yesterday. No vaginal discharge. Is also having increased urinary frequency. Denies lower back pain, fever, or chills. Requesting appointment for today. Appointment scheduled for today at 11:15am with Regina Eck CNM. Patient is agreeable to date and time.  Routing to provider for final review. Patient agreeable to disposition. Patient aware provider will review message and nurse will return call with any additional instructions or change of disposition. Will close encounter.

## 2014-08-22 NOTE — Telephone Encounter (Signed)
Patient states she woke up with some symptoms down there and wants to be seen today.

## 2014-08-22 NOTE — Progress Notes (Signed)
24 y.o.Single white female  g0p0 here with complaint of UTI, with onset  on 3 days ago.. Patient complaining of urinary frequency/urgency/ and incomplete emptying. Patient denies fever, chills, nausea or back pain. Has used new personal product of new body wash.. Patient feels maybe related to sexual activity. Denies oral sexual activity recently. Also having  vaginal symptoms of irritation and increase  itching, slight  increase in discharge, no odor.   Contraception is consistent condom use.. Patient needs to increase fluid intake. New partner desires STD screening.  O: Healthy female WDWN Affect: Normal, orientation x 3 Skin : warm and dry CVAT: negative bilateral Abdomen: negative for suprapubic tenderness  Pelvic exam: External genital area: normal female with redness on both labia and slight edema, tender, no lesions, scaling with exudate noted, wet prep taken Bladder,Urethra non tender, Urethral meatus: tender, red Vagina: white slightly thin slight odorousl vaginal discharge, slightly tender Wet prep taken, ph 4.5 Cervix: normal, non tender Uterus:normal,non tender Adnexa: normal non tender, no fullness or masses  Wet prep results: positive for yeast vaginal and vulva, clue cells Urine: 2+ blood  A:Normal pelvic exam Yeast vaginitis/vulvitis BV R/O UTI STD screening desired   P: Reviewed findings of Yeast vaginitis/vulvitis and BV and need for treatment. [id number] see order [id number] see order Discussed Aveeno sitz bath for comfort. Avoid thong underwear or wet clothing for prolonged periods of time. Can use OTC 1/2% hydrocortisone cream to vulva area bid if needed for itching for 3 days. Discussed increase water intake to see if urinary issues resolve and feel maybe just vaginally related from irritation around urethral meatus. Reviewed warning signs of UTI and will call if present. Encouraged to limit soda, tea, and coffee Lab: Urine micro and Culture Labs: GC, Chlamydia,  STD panel, Hep C, HSV 1,2   RV prn

## 2014-08-22 NOTE — Patient Instructions (Signed)
Monilial Vaginitis Vaginitis in a soreness, swelling and redness (inflammation) of the vagina and vulva. Monilial vaginitis is not a sexually transmitted infection. CAUSES  Yeast vaginitis is caused by yeast (candida) that is normally found in your vagina. With a yeast infection, the candida has overgrown in number to a point that upsets the chemical balance. SYMPTOMS   White, thick vaginal discharge.  Swelling, itching, redness and irritation of the vagina and possibly the lips of the vagina (vulva).  Burning or painful urination.  Painful intercourse. DIAGNOSIS  Things that may contribute to monilial vaginitis are:  Postmenopausal and virginal states.  Pregnancy.  Infections.  Being tired, sick or stressed, especially if you had monilial vaginitis in the past.  Diabetes. Good control will help lower the chance.  Birth control pills.  Tight fitting garments.  Using bubble bath, feminine sprays, douches or deodorant tampons.  Taking certain medications that kill germs (antibiotics).  Sporadic recurrence can occur if you become ill. TREATMENT  Your caregiver will give you medication.  There are several kinds of anti monilial vaginal creams and suppositories specific for monilial vaginitis. For recurrent yeast infections, use a suppository or cream in the vagina 2 times a week, or as directed.  Anti-monilial or steroid cream for the itching or irritation of the vulva may also be used. Get your caregiver's permission.  Painting the vagina with methylene blue solution may help if the monilial cream does not work.  Eating yogurt may help prevent monilial vaginitis. HOME CARE INSTRUCTIONS   Finish all medication as prescribed.  Do not have sex until treatment is completed or after your caregiver tells you it is okay.  Take warm sitz baths.  Do not douche.  Do not use tampons, especially scented ones.  Wear cotton underwear.  Avoid tight pants and panty  hose.  Tell your sexual partner that you have a yeast infection. They should go to their caregiver if they have symptoms such as mild rash or itching.  Your sexual partner should be treated as well if your infection is difficult to eliminate.  Practice safer sex. Use condoms.  Some vaginal medications cause latex condoms to fail. Vaginal medications that harm condoms are:  Cleocin cream.  Butoconazole (Femstat).  Terconazole (Terazol) vaginal suppository.  Miconazole (Monistat) (may be purchased over the counter). SEEK MEDICAL CARE IF:   You have a temperature by mouth above 102 F (38.9 C).  The infection is getting worse after 2 days of treatment.  The infection is not getting better after 3 days of treatment.  You develop blisters in or around your vagina.  You develop vaginal bleeding, and it is not your menstrual period.  You have pain when you urinate.  You develop intestinal problems.  You have pain with sexual intercourse. Document Released: 01/02/2005 Document Revised: 06/17/2011 Document Reviewed: 09/16/2008 [facility] Patient Information 2015 [place], [place]. This information is not intended to replace advice given to you by your health care provider. Make sure you discuss any questions you have with your health care provider. Bacterial Vaginosis Bacterial vaginosis is a vaginal infection that occurs when the normal balance of bacteria in the vagina is disrupted. It results from an overgrowth of certain bacteria. This is the most common vaginal infection in women of childbearing age. Treatment is important to prevent complications, especially in pregnant women, as it can cause a premature delivery. CAUSES  Bacterial vaginosis is caused by an increase in harmful bacteria that are normally present in smaller amounts in the  vagina. Several different kinds of bacteria can cause bacterial vaginosis. However, the reason that the condition develops is not fully  understood. RISK FACTORS Certain activities or behaviors can put you at an increased risk of developing bacterial vaginosis, including:  Having a new sex partner or multiple sex partners.  Douching.  Using an intrauterine device (IUD) for contraception. Women do not get bacterial vaginosis from toilet seats, bedding, swimming pools, or contact with objects around them. SIGNS AND SYMPTOMS  Some women with bacterial vaginosis have no signs or symptoms. Common symptoms include:  Grey vaginal discharge.  A fishlike odor with discharge, especially after sexual intercourse.  Itching or burning of the vagina and vulva.  Burning or pain with urination. DIAGNOSIS  Your health care provider will take a medical history and examine the vagina for signs of bacterial vaginosis. A sample of vaginal fluid may be taken. Your health care provider will look at this sample under a microscope to check for bacteria and abnormal cells. A vaginal pH test may also be done.  TREATMENT  Bacterial vaginosis may be treated with antibiotic medicines. These may be given in the form of a pill or a vaginal cream. A second round of antibiotics may be prescribed if the condition comes back after treatment.  HOME CARE INSTRUCTIONS   Only take over-the-counter or prescription medicines as directed by your health care provider.  If antibiotic medicine was prescribed, take it as directed. Make sure you finish it even if you start to feel better.  Do not have sex until treatment is completed.  Tell all sexual partners that you have a vaginal infection. They should see their health care provider and be treated if they have problems, such as a mild rash or itching.  Practice safe sex by using condoms and only having one sex partner. SEEK MEDICAL CARE IF:   Your symptoms are not improving after 3 days of treatment.  You have increased discharge or pain.  You have a fever. MAKE SURE YOU:   Understand these  instructions.  Will watch your condition.  Will get help right away if you are not doing well or get worse. FOR MORE INFORMATION  Centers for Disease Control and Prevention, Division of STD Prevention: AppraiserFraud.fi American Sexual Health Association (ASHA): www.ashastd.org  Document Released: 03/25/2005 Document Revised: 01/13/2013 Document Reviewed: 11/04/2012 [facility] Patient Information 2015 [place], [place]. This information is not intended to replace advice given to you by your health care provider. Make sure you discuss any questions you have with your health care provider.

## 2014-08-23 LAB — STD PANEL
HIV: NONREACTIVE
Hepatitis B Surface Ag: NEGATIVE

## 2014-08-23 LAB — URINALYSIS, MICROSCOPIC ONLY
CASTS: NONE SEEN
CRYSTALS: NONE SEEN
Squamous Epithelial / LPF: NONE SEEN

## 2014-08-23 LAB — HEPATITIS C ANTIBODY: HCV AB: NEGATIVE

## 2014-08-23 NOTE — Progress Notes (Signed)
Reviewed personally.  M. Suzanne Cherrie Franca, MD.  

## 2014-08-24 LAB — IPS N GONORRHOEA AND CHLAMYDIA BY PCR

## 2014-08-24 LAB — HSV(HERPES SIMPLEX VRS) I + II AB-IGG
HSV 1 Glycoprotein G Ab, IgG: <0.1 IV
HSV 2 Glycoprotein G Ab, IgG: <0.1 IV

## 2014-08-24 LAB — URINE CULTURE
COLONY COUNT: NO GROWTH
ORGANISM ID, BACTERIA: NO GROWTH

## 2014-08-31 ENCOUNTER — Encounter: Payer: Self-pay | Admitting: Certified Nurse Midwife

## 2014-08-31 ENCOUNTER — Ambulatory Visit (INDEPENDENT_AMBULATORY_CARE_PROVIDER_SITE_OTHER): Payer: BLUE CROSS/BLUE SHIELD | Admitting: Certified Nurse Midwife

## 2014-08-31 ENCOUNTER — Telehealth: Payer: Self-pay | Admitting: Certified Nurse Midwife

## 2014-08-31 VITALS — BP 118/70 | HR 70 | Resp 16 | Ht 62.75 in | Wt 167.0 lb

## 2014-08-31 DIAGNOSIS — Z308 Encounter for other contraceptive management: Secondary | ICD-10-CM

## 2014-08-31 DIAGNOSIS — Z789 Other specified health status: Secondary | ICD-10-CM

## 2014-08-31 LAB — POCT URINE PREGNANCY: Preg Test, Ur: NEGATIVE

## 2014-08-31 NOTE — Progress Notes (Signed)
Reviewed personally.  M. Suzanne Kiaraliz Rafuse, MD.  

## 2014-08-31 NOTE — Progress Notes (Signed)
23 yrs Caucasian Single fe female G0P0000  presents for Nexplanon insertion. LMP5/23/16         Contraceptioncondoms consistent. Questions addressed.  Consent signed.  HPI neg.  Healthy female WDWN  Procedure: Patient placed supine on exam table with herleft arm   flexed at the elbow and externally rotated.The insertion site was identified on the inner side of upper arm.  Two marks were made 8-10cm above the medial epicondyle of the humerus.  The first mark for insertion and the second mark 4cm from the first. The insertion sited was cleansed with betadine solution. 2 ml of 1% Lidocaine was injected along the track of the insertion site. The Nexaplon under sterile conditions was inserted in left arm. The implant was palpated in the arm after insertion. A small adhesive bandage placed over insertion site. The patient palpated the device for monitoring of the device. No active bleeding was noted.  A pressure bandage was place over the site.  Assessment:Nexaplon Insertion Pt tolerated procedure well.  Plan:Instructions and warning signs and symptoms given. Discussed condom use for STD protection. Questions addressed.   Rv one month, prn

## 2014-08-31 NOTE — Telephone Encounter (Addendum)
Spoke with patient. Patient states that her cycle started on 5/23. Patient would like to schedule nexplanon insertion for today. Advised patient no appointments available today with Dr.Silva. Patient asking to see NP for insertion. Advised insertion will need to be performed with Dr.Silva as this is who she consulted with regarding the insertion. Offered patient tomorrow at 10am and 1pm but patient declines. "I am only off work today and can not come the rest of the week." Advised I can speak with Dr.Silva regarding insertion and return call. Advised may need to wait for insertion with next cycle. Patient is agreeable.

## 2014-08-31 NOTE — Telephone Encounter (Addendum)
Spoke with patient. Advised I have spoken with Dr.Silva and insertion may be done today in office with Regina Eck CNM. I have also spoken with Regina Eck CNM who is agreeable to insertion today. Appointment scheduled for today at 10:45am with Regina Eck CNM. Patient is agreeable to date and time.  Cc: Regina Eck CNM   Routing to provider for final review. Patient agreeable to disposition. Will close encounter.

## 2014-08-31 NOTE — Telephone Encounter (Signed)
Encounter closed in error. Please see telephone message below.

## 2014-08-31 NOTE — Patient Instructions (Signed)
Etonogestrel implant What is this medicine? ETONOGESTREL (et oh noe JES trel) is a contraceptive (birth control) device. It is used to prevent pregnancy. It can be used for up to 3 years. This medicine may be used for other purposes; ask your health care provider or pharmacist if you have questions. COMMON BRAND NAME(S): Implanon, Nexplanon What should I tell my health care provider before I take this medicine? They need to know if you have any of these conditions: -abnormal vaginal bleeding -blood vessel disease or blood clots -cancer of the breast, cervix, or liver -depression -diabetes -gallbladder disease -headaches -heart disease or recent heart attack -high blood pressure -high cholesterol -kidney disease -liver disease -renal disease -seizures -tobacco smoker -an unusual or allergic reaction to etonogestrel, other hormones, anesthetics or antiseptics, medicines, foods, dyes, or preservatives -pregnant or trying to get pregnant -breast-feeding How should I use this medicine? This device is inserted just under the skin on the inner side of your upper arm by a health care professional. Talk to your pediatrician regarding the use of this medicine in children. Special care may be needed. Overdosage: If you think you've taken too much of this medicine contact a poison control center or emergency room at once. Overdosage: If you think you have taken too much of this medicine contact a poison control center or emergency room at once. NOTE: This medicine is only for you. Do not share this medicine with others. What if I miss a dose? This does not apply. What may interact with this medicine? Do not take this medicine with any of the following medications: -amprenavir -bosentan -fosamprenavir This medicine may also interact with the following medications: -barbiturate medicines for inducing sleep or treating seizures -certain medicines for fungal infections like ketoconazole and  itraconazole -griseofulvin -medicines to treat seizures like carbamazepine, felbamate, oxcarbazepine, phenytoin, topiramate -modafinil -phenylbutazone -rifampin -some medicines to treat HIV infection like atazanavir, indinavir, lopinavir, nelfinavir, tipranavir, ritonavir -St. John's wort This list may not describe all possible interactions. Give your health care provider a list of all the medicines, herbs, non-prescription drugs, or dietary supplements you use. Also tell them if you smoke, drink alcohol, or use illegal drugs. Some items may interact with your medicine. What should I watch for while using this medicine? This product does not protect you against HIV infection (AIDS) or other sexually transmitted diseases. You should be able to feel the implant by pressing your fingertips over the skin where it was inserted. Tell your doctor if you cannot feel the implant. What side effects may I notice from receiving this medicine? Side effects that you should report to your doctor or health care professional as soon as possible: -allergic reactions like skin rash, itching or hives, swelling of the face, lips, or tongue -breast lumps -changes in vision -confusion, trouble speaking or understanding -dark urine -depressed mood -general ill feeling or flu-like symptoms -light-colored stools -loss of appetite, nausea -right upper belly pain -severe headaches -severe pain, swelling, or tenderness in the abdomen -shortness of breath, chest pain, swelling in a leg -signs of pregnancy -sudden numbness or weakness of the face, arm or leg -trouble walking, dizziness, loss of balance or coordination -unusual vaginal bleeding, discharge -unusually weak or tired -yellowing of the eyes or skin Side effects that usually do not require medical attention (Report these to your doctor or health care professional if they continue or are bothersome.): -acne -breast pain -changes in  weight -cough -fever or chills -headache -irregular menstrual bleeding -itching, burning, and   vaginal discharge -pain or difficulty passing urine -sore throat This list may not describe all possible side effects. Call your doctor for medical advice about side effects. You may report side effects to FDA at 1-800-FDA-1088. Where should I keep my medicine? This drug is given in a hospital or clinic and will not be stored at home. NOTE: This sheet is a summary. It may not cover all possible information. If you have questions about this medicine, talk to your doctor, pharmacist, or health care provider.  2015, Elsevier/Gold Standard. (2011-09-30 15:37:45)  

## 2014-08-31 NOTE — Telephone Encounter (Signed)
Patient's cycle came on 2 days ago and she's calling to schedule the nexplanon procedure.

## 2014-09-16 ENCOUNTER — Ambulatory Visit: Payer: BLUE CROSS/BLUE SHIELD | Admitting: Internal Medicine

## 2014-09-27 ENCOUNTER — Ambulatory Visit (INDEPENDENT_AMBULATORY_CARE_PROVIDER_SITE_OTHER): Payer: BLUE CROSS/BLUE SHIELD | Admitting: Family Medicine

## 2014-09-27 VITALS — BP 110/64 | HR 93 | Temp 98.9°F | Resp 20 | Ht 63.75 in | Wt 163.2 lb

## 2014-09-27 DIAGNOSIS — J039 Acute tonsillitis, unspecified: Secondary | ICD-10-CM | POA: Diagnosis not present

## 2014-09-27 MED ORDER — AMOXICILLIN-POT CLAVULANATE 600-42.9 MG/5ML PO SUSR: 600.0000 mg | Freq: Two times a day (BID) | ORAL | Status: DC

## 2014-09-27 NOTE — Progress Notes (Signed)
Patient ID: HAIDEN CLUCAS MRN: [id number], DOB: Jun 14, 1990, 24 y.o. Date of Encounter: 09/27/2014, 11:50 AM  Primary Physician: Lottie Dawson, MD  Chief Complaint:  Chief Complaint  Patient presents with  . Sore Throat    sore throat that started yesterday.  . Ear Pain    bil but the pain is worse on the right side    HPI: 24 y.o. year old female presents with day history of sore throat. Subjective fever and chills. No cough, congestion, rhinorrhea, sinus pressure, otalgia, or headache. Normal hearing. No GI complaints. Able to swallow saliva, but hurts to do so. Decreased appetite secondary to sore throat.   Past Medical History  Diagnosis Date  . Constipation   . CONSTIPATION, CHRONIC 24/26/2009    Qualifier: Diagnosis of  By: Sherren Mocha MD, Jory Ee   . Headache(784.0) 02/26/2010    Qualifier: Diagnosis of  By: Regis Bill MD, Standley Brooking   . Irregular menses     Dr Helane Rima  . Blood transfusion without reported diagnosis     at birth  . Dysmenorrhea      Home Meds: Prior to Admission medications   Medication Sig Start Date End Date Taking? Authorizing Provider  etonogestrel (NEXPLANON) 68 MG IMPL implant 1 each by Subdermal route once.   Yes Historical Provider, MD  amoxicillin-clavulanate (AUGMENTIN ES-600) 600-42.9 MG/5ML suspension Take 5 mLs (600 mg total) by mouth 2 (two) times daily. 09/27/14   Robyn Haber, MD    Allergies: No Known Allergies  History   Social History  . Marital Status: Single    Spouse Name: N/A  . Number of Children: N/A  . Years of Education: N/A   Occupational History  . Not on file.   Social History Main Topics  . Smoking status: Never Smoker   . Smokeless tobacco: Never Used  . Alcohol Use: No  . Drug Use: No  . Sexual Activity:    Partners: Male    Birth Control/ Protection: Condom   Other Topics Concern  . Not on file   Social History Narrative   hh of 4  With family   No pets   Went to [employer]  Going to [place] smokes.   But neg ets      Review of Systems: Constitutional: negative for chills, fever, night sweats or weight changes HEENT: see above Cardiovascular: negative for chest pain or palpitations Respiratory: negative for hemoptysis, wheezing, or shortness of breath Abdominal: negative for abdominal pain, nausea, vomiting or diarrhea Dermatological: negative for rash Neurologic: negative for headache   Physical Exam: Blood pressure 110/64, pulse 93, temperature 98.9 F (37.2 C), temperature source Oral, resp. rate 20, height 5' 3.75" (1.619 m), weight 163 lb 4 oz (74.05 kg), last menstrual period 09/19/2014, SpO2 99 %., Body mass index is 28.25 kg/(m^2). General: Well developed, well nourished, in no acute distress. Head: Normocephalic, atraumatic, eyes without discharge, sclera non-icteric, nares are patent. Posterior pharynx shows 3+ tonsils with exudates bilaterally Bilateral auditory canals clear, TM's are without perforation, pearly grey with reflective cone of light bilaterally. No sinus TTP. Oral cavity moist, dentition normal. I Neck: Supple. No thyromegaly. Full ROM. No lymphadenopathy. Lungs: Clear bilaterally to auscultation without wheezes, rales, or rhonchi. Breathing is unlabored. Heart: RRR with S1 S2. No murmurs, rubs, or gallops appreciated.  Msk:  Strength and tone normal for age. Extremities: No clubbing or cyanosis. No edema. Neuro: Alert and oriented X 3. Moves all  extremities spontaneously. CNII-XII grossly in tact. Psych:  Responds to questions appropriately with a normal affect.     ASSESSMENT AND PLAN:  24 y.o. year old female with acute pharyngitis  This chart was scribed in my presence and reviewed by me personally.    ICD-9-CM ICD-10-CM   1. Acute tonsillitis 463 J03.90 amoxicillin-clavulanate (AUGMENTIN ES-600) 600-42.9 MG/5ML suspension     Signed, Robyn Haber, MD  - -Tylenol/Motrin prn -Rest/fluids -RTC precautions -RTC  3-5 days if no improvement  Signed, Robyn Haber, MD 09/27/2014 11:50 AM

## 2014-09-27 NOTE — Patient Instructions (Signed)

## 2014-10-05 ENCOUNTER — Ambulatory Visit (INDEPENDENT_AMBULATORY_CARE_PROVIDER_SITE_OTHER): Payer: BLUE CROSS/BLUE SHIELD | Admitting: Certified Nurse Midwife

## 2014-10-05 ENCOUNTER — Encounter: Payer: Self-pay | Admitting: Certified Nurse Midwife

## 2014-10-05 VITALS — BP 114/76 | HR 80 | Ht 63.75 in | Wt 160.2 lb

## 2014-10-05 DIAGNOSIS — Z3049 Encounter for surveillance of other contraceptives: Secondary | ICD-10-CM

## 2014-10-05 NOTE — Patient Instructions (Signed)
Etonogestrel implant What is this medicine? ETONOGESTREL (et oh noe JES trel) is a contraceptive (birth control) device. It is used to prevent pregnancy. It can be used for up to 3 years. This medicine may be used for other purposes; ask your health care provider or pharmacist if you have questions. COMMON BRAND NAME(S): Implanon, Nexplanon What should I tell my health care provider before I take this medicine? They need to know if you have any of these conditions: -abnormal vaginal bleeding -blood vessel disease or blood clots -cancer of the breast, cervix, or liver -depression -diabetes -gallbladder disease -headaches -heart disease or recent heart attack -high blood pressure -high cholesterol -kidney disease -liver disease -renal disease -seizures -tobacco smoker -an unusual or allergic reaction to etonogestrel, other hormones, anesthetics or antiseptics, medicines, foods, dyes, or preservatives -pregnant or trying to get pregnant -breast-feeding How should I use this medicine? This device is inserted just under the skin on the inner side of your upper arm by a health care professional. Talk to your pediatrician regarding the use of this medicine in children. Special care may be needed. Overdosage: If you think you've taken too much of this medicine contact a poison control center or emergency room at once. Overdosage: If you think you have taken too much of this medicine contact a poison control center or emergency room at once. NOTE: This medicine is only for you. Do not share this medicine with others. What if I miss a dose? This does not apply. What may interact with this medicine? Do not take this medicine with any of the following medications: -amprenavir -bosentan -fosamprenavir This medicine may also interact with the following medications: -barbiturate medicines for inducing sleep or treating seizures -certain medicines for fungal infections like ketoconazole and  itraconazole -griseofulvin -medicines to treat seizures like carbamazepine, felbamate, oxcarbazepine, phenytoin, topiramate -modafinil -phenylbutazone -rifampin -some medicines to treat HIV infection like atazanavir, indinavir, lopinavir, nelfinavir, tipranavir, ritonavir -St. John's wort This list may not describe all possible interactions. Give your health care provider a list of all the medicines, herbs, non-prescription drugs, or dietary supplements you use. Also tell them if you smoke, drink alcohol, or use illegal drugs. Some items may interact with your medicine. What should I watch for while using this medicine? This product does not protect you against HIV infection (AIDS) or other sexually transmitted diseases. You should be able to feel the implant by pressing your fingertips over the skin where it was inserted. Tell your doctor if you cannot feel the implant. What side effects may I notice from receiving this medicine? Side effects that you should report to your doctor or health care professional as soon as possible: -allergic reactions like skin rash, itching or hives, swelling of the face, lips, or tongue -breast lumps -changes in vision -confusion, trouble speaking or understanding -dark urine -depressed mood -general ill feeling or flu-like symptoms -light-colored stools -loss of appetite, nausea -right upper belly pain -severe headaches -severe pain, swelling, or tenderness in the abdomen -shortness of breath, chest pain, swelling in a leg -signs of pregnancy -sudden numbness or weakness of the face, arm or leg -trouble walking, dizziness, loss of balance or coordination -unusual vaginal bleeding, discharge -unusually weak or tired -yellowing of the eyes or skin Side effects that usually do not require medical attention (Report these to your doctor or health care professional if they continue or are bothersome.): -acne -breast pain -changes in  weight -cough -fever or chills -headache -irregular menstrual bleeding -itching, burning, and   vaginal discharge -pain or difficulty passing urine -sore throat This list may not describe all possible side effects. Call your doctor for medical advice about side effects. You may report side effects to FDA at 1-800-FDA-1088. Where should I keep my medicine? This drug is given in a hospital or clinic and will not be stored at home. NOTE: This sheet is a summary. It may not cover all possible information. If you have questions about this medicine, talk to your doctor, pharmacist, or health care provider.  2015, Elsevier/Gold Standard. (2011-09-30 15:37:45)  

## 2014-10-05 NOTE — Progress Notes (Signed)
24 y.o. Single Caucasian G0P0000here for evaluation of Nexplanon initiated on 08/31/14 for contraception. Menses duration 6 days with moderate to light flow. Patient noted no cramping with period. Denies pain at insertion site or tenderness. Patient is able to feel Nexplanon device and is intact. Denies problems after insertion other than bruising, which resolved. Happy with choice. No problems today.  O: Healthy female, WD WF Affect: normal orientation X 3  Left arm insertion site well healed, Nexplanon easily palpated intact, non tender.   A: Nexplanon surveillance normal appearance and bleeding profile  P: Reviewed bleeding expectations again with patient and reminded of adjustment period with menses cycle. Questions addressed. Warning signs reviewed.   RV prn, aex

## 2014-10-06 NOTE — Progress Notes (Signed)
Reviewed personally.  M. Suzanne Jay Haskew, MD.  

## 2014-11-03 ENCOUNTER — Encounter: Payer: Self-pay | Admitting: Certified Nurse Midwife

## 2014-11-03 ENCOUNTER — Ambulatory Visit (INDEPENDENT_AMBULATORY_CARE_PROVIDER_SITE_OTHER): Payer: BLUE CROSS/BLUE SHIELD | Admitting: Certified Nurse Midwife

## 2014-11-03 VITALS — BP 114/70 | HR 68 | Resp 14 | Wt 158.8 lb

## 2014-11-03 DIAGNOSIS — Z113 Encounter for screening for infections with a predominantly sexual mode of transmission: Secondary | ICD-10-CM | POA: Diagnosis not present

## 2014-11-03 DIAGNOSIS — N898 Other specified noninflammatory disorders of vagina: Secondary | ICD-10-CM | POA: Diagnosis not present

## 2014-11-03 NOTE — Progress Notes (Signed)
24 y.o.Single white female g0p0 here with complaint of vaginal symptoms of itching, burning, and minimal watery  discharge. Describes discharge as odorous..Onset of symptoms 4 days ago. Denies new personal products. Patient has been in pool frequently and new partner in the past one week. Has STD concerns and would like STD screening. Urinary symptoms none Contraception is Nexplanon.   O:Healthy female WDWN Affect: normal, orientation x 3  Exam: Nexplanon noted in left arm intact Abdomen: soft, no masses or tenderness Lymph node: no enlargement or tenderness Pelvic exam: External genital: normal female, with slight edema noted, no redness or scaling or exudate BUS: negative Vagina: rusty colored non odorous discharge noted. , Affirm taken Cervix: normal, non tender, no CMT Uterus: normal, non tender Adnexa:normal, non tender, no masses or fullness noted  A:Normal pelvic exam STD screening Vaginal discharge R/O infection    P:Discussed findings of normal exam and vaginal discharge related to new partner and  etiology. Discussed Aveeno or baking soda sitz bath for comfort. Avoid moist clothes or pads for extended period of time. If working out in gym clothes or swim suits for long periods of time change underwear or bottoms of swimsuit if possible. Lab: Affirm, GC, Chlamydia Will treat as indicated. Stressed condom use for protection.  Rv prn

## 2014-11-04 ENCOUNTER — Other Ambulatory Visit: Payer: Self-pay | Admitting: Certified Nurse Midwife

## 2014-11-04 DIAGNOSIS — B9689 Other specified bacterial agents as the cause of diseases classified elsewhere: Secondary | ICD-10-CM

## 2014-11-04 DIAGNOSIS — B3731 Acute candidiasis of vulva and vagina: Secondary | ICD-10-CM

## 2014-11-04 DIAGNOSIS — N760 Acute vaginitis: Secondary | ICD-10-CM

## 2014-11-04 DIAGNOSIS — B373 Candidiasis of vulva and vagina: Secondary | ICD-10-CM

## 2014-11-04 LAB — WET PREP BY MOLECULAR PROBE
CANDIDA SPECIES: POSITIVE — AB
Gardnerella vaginalis: POSITIVE — AB
Trichomonas vaginosis: NEGATIVE

## 2014-11-04 MED ORDER — HYLAFEM VA SUPP: 1.0000 | Freq: Every day | VAGINAL | Status: DC

## 2014-11-04 NOTE — Progress Notes (Signed)
Reviewed personally.  M. Suzanne Caylyn Tedeschi, MD.  

## 2014-11-05 LAB — IPS N GONORRHOEA AND CHLAMYDIA BY PCR

## 2014-11-15 ENCOUNTER — Encounter: Payer: Self-pay | Admitting: Family Medicine

## 2014-11-15 ENCOUNTER — Ambulatory Visit (INDEPENDENT_AMBULATORY_CARE_PROVIDER_SITE_OTHER): Payer: BLUE CROSS/BLUE SHIELD | Admitting: Family Medicine

## 2014-11-15 VITALS — BP 110/64 | HR 60 | Temp 98.0°F | Ht 63.75 in | Wt 158.0 lb

## 2014-11-15 DIAGNOSIS — F411 Generalized anxiety disorder: Secondary | ICD-10-CM | POA: Diagnosis not present

## 2014-11-15 DIAGNOSIS — Z0000 Encounter for general adult medical examination without abnormal findings: Secondary | ICD-10-CM

## 2014-11-15 DIAGNOSIS — Z975 Presence of (intrauterine) contraceptive device: Secondary | ICD-10-CM

## 2014-11-15 DIAGNOSIS — Z111 Encounter for screening for respiratory tuberculosis: Secondary | ICD-10-CM | POA: Diagnosis not present

## 2014-11-15 MED ORDER — VENLAFAXINE HCL ER 37.5 MG PO CP24: ORAL_CAPSULE | ORAL | Status: DC

## 2014-11-15 NOTE — Patient Instructions (Signed)
It was a pleasure meeting you today Angela Jordan. I have called in your Effexor, start at 37.5 mg for 5 days and then take 75 mg every day. Can take this in the morning, it should not make you drowsy. I will need to see you for follow-up in 3 weeks to see how you're doing. Make certain to return to the clinic within 48-72 hours to have  PPD read

## 2014-11-15 NOTE — Progress Notes (Signed)
Pre visit review using our clinic review tool, if applicable. No additional management support is needed unless otherwise documented below in the visit note. 

## 2014-11-15 NOTE — Progress Notes (Signed)
Subjective:    Patient ID: Angela Jordan, female    DOB: 1990/08/24, 24 y.o.   MRN: [id number]  HPI  New pt transfer to [facility] location.  Well women: Patient states that she's had a Nexplanon implant in her left arm in June 2016. Since then she's had only spotting on occasions. She does use condoms, is sexually active with one female partner. She has no complaints today.  Anxiety: Patient reports anxiety feelings surrounding school and testing situations. Patient is a Equities trader at [employer]. She has never been medicated for anxiety in the past. She does not have any mood disorders or depression. There is no family history of anxiety, depression or mood disorders.  - Health maintenance: Pap smears are completed by OB/GYN, all normal Paps. Patient on Nexplanon for birth control.- Patient is up-to-date on all immunizations, will need to get records from her OB/GYN to make sure she had all 3 doses of HPV. Flu shot needed in October. Tetanus up-to-date. HIV screening completed.  Social: Single. Relationship, female partner, feels safe. Lives with mom and brother.   Past Medical History  Diagnosis Date  . Constipation   . CONSTIPATION, CHRONIC 06/04/2007    Qualifier: Diagnosis of  By: Sherren Mocha MD, Jory Ee   . Headache(784.0) 02/26/2010    Qualifier: Diagnosis of  By: Regis Bill MD, Standley Brooking   . Irregular menses     Dr Helane Rima  . Blood transfusion without reported diagnosis     at birth  . Dysmenorrhea    Past Surgical History  Procedure Laterality Date  . Wisdom tooth extraction     No Known Allergies Family History  Problem Relation Age of Onset  . Hypertension Father   . Diabetes Maternal Aunt   . Thyroid disease Paternal Aunt   . Diabetes Maternal Grandmother   . Heart disease Maternal Grandmother   . Leukemia Maternal Grandfather   . Breast cancer Paternal Grandmother   . Lymphoma Paternal Grandmother    History   Social History  . Marital Status: Single   Spouse Name: N/A  . Number of Children: N/A  . Years of Education: N/A   Occupational History  . Not on file.   Social History Main Topics  . Smoking status: Never Smoker   . Smokeless tobacco: Never Used  . Alcohol Use: No  . Drug Use: No  . Sexual Activity:    Partners: Male    Birth Control/ Protection: Condom   Other Topics Concern  . Not on file   Social History Narrative   hh of 4  With family   No pets   Went to [employer]  Going to [place] smokes.   But neg ets     Review of Systems  Constitutional: Negative.   HENT: Negative.   Eyes: Negative.   Respiratory: Negative.   Cardiovascular: Negative.   Gastrointestinal: Positive for constipation.  Endocrine: Negative.   Genitourinary: Negative.   Musculoskeletal: Negative.   Skin: Negative.   Allergic/Immunologic: Negative.   Neurological: Negative.   Hematological: Negative.   Psychiatric/Behavioral: Negative for suicidal ideas. The patient is nervous/anxious.        Objective:   Physical Exam BP 110/64 mmHg  Pulse 60  Temp(Src) 98 F (36.7 C) (Oral)  Ht 5' 3.75" (1.619 m)  Wt 158 lb (71.668 kg)  BMI 27.34 kg/m2  SpO2 98%  LMP 11/01/2014 Gen: NAD. Nontoxic in appearance, well-developed, well-nourished Caucasian female, makes good  eye contact, pleasant, smiles. HEENT: AT. [place]. Bilateral TM visualized and normal in appearance. Bilateral eyes without injections, drainage or icterus. MMM. Bilateral nares without erythema, rhinorrhea or swelling. Throat without erythema or exudates.  Neck: Supple, no lymphadenopathy, no thyromegaly CV: RRR no murmurs appreciated Chest: CTAB, no wheeze or crackles Abd: Soft. Flat. NTND. BS present. No Masses palpated.  Ext: No erythema. No edema. +2/4 PT bilaterally Skin: No rashes, purpura or petechiae.  Neuro:  Normal gait. PERLA. EOMi. Alert. Oriented. Cranial nerves II through XII intact. No focal deficits. Muscle strength 5/5 upper and lower extremity.  DTRs equal bilaterally. Psych: Normal affect, dress and demeanor. Normal speech. Normal mood, thought and judgment. Appeared mildly tearful and anxious when speaking about anxiety and school. GAD: 9-10, somewhat difficult for her daily life.     Assessment & Plan:  Angela Jordan is 24 y.o. female present for patient transfer to [facility] location. - Health maintenance: Pap smears are completed by OB/GYN, all normal Paps. Patient on Nexplanon for birth control.- Patient is up-to-date on all immunizations, will need to get records from her OB/GYN to make sure she had all 3 doses of HPV. Flu shot needed in October. Tetanus up-to-date. HIV screening completed.  - Generalized anxiety disorder: 9-10 today. Patient with moderate anxiety, feels heart racing at times when nervous. Currently in a senior in nursing school, feels that work/school is causing the majority of her anxiety. Discussed medication treatment today and patient is open to this option. Discussed behavior modifications with exercise/yoga etc. Effexor 37.5 mg 5 days, then 75 mg daily. Patient to follow-up in 3 weeks.

## 2014-11-15 NOTE — Assessment & Plan Note (Signed)
Angela Jordan is 24 y.o. female present for patient transfer to [facility] location. - Health maintenance: Pap smears are completed by OB/GYN, all normal Paps. Patient on Nexplanon for birth control.- Patient is up-to-date on all immunizations, will need to get records from her OB/GYN to make sure she had all 3 doses of HPV. Flu shot needed in October. Tetanus up-to-date. HIV screening completed.

## 2014-11-15 NOTE — Assessment & Plan Note (Signed)
 -   Generalized anxiety disorder: 9-10 today. Patient with moderate anxiety, feels heart racing at times when nervous. Currently in a senior in nursing school, feels that work/school is causing the majority of her anxiety. Discussed medication treatment today and patient is open to this option. Discussed behavior modifications with exercise/yoga etc. Effexor 37.5 mg 5 days, then 75 mg daily. Patient to follow-up in 3 weeks.

## 2014-11-17 ENCOUNTER — Ambulatory Visit: Payer: BLUE CROSS/BLUE SHIELD

## 2014-11-17 LAB — TB SKIN TEST
Induration: 0 mm
TB Skin Test: NEGATIVE

## 2014-12-02 ENCOUNTER — Encounter: Payer: Self-pay | Admitting: Family Medicine

## 2014-12-02 ENCOUNTER — Ambulatory Visit (INDEPENDENT_AMBULATORY_CARE_PROVIDER_SITE_OTHER): Payer: BLUE CROSS/BLUE SHIELD | Admitting: Family Medicine

## 2014-12-02 VITALS — BP 119/77 | HR 85 | Temp 97.0°F | Resp 18 | Wt 156.4 lb

## 2014-12-02 DIAGNOSIS — F411 Generalized anxiety disorder: Secondary | ICD-10-CM

## 2014-12-02 MED ORDER — VENLAFAXINE HCL ER 75 MG PO CP24: ORAL_CAPSULE | ORAL | Status: DC

## 2014-12-02 NOTE — Progress Notes (Signed)
   Subjective:    Patient ID: Angela Jordan, female    DOB: 07/08/1990, 24 y.o.   MRN: [id number]  HPI  Generalized anxiety: Patient presents to office today for follow-up on anxiety and start of Effexor. Patient reports no negative side effects. She states when she went from the 37.5 mg to 75 mg she did have a headache. She states that resolved, and she has not had any additional headaches. She reports she cannot refill some of the positive side effects, and she feels more relaxed. She states when her classmates are stressing out over tests, she does not feels anxious as she did prior. She feels better equipped to take on stresses of her classes in social situations. She denies any chest pain, dizziness, continued headaches or visual changes.  Past Medical History  Diagnosis Date  . Constipation   . CONSTIPATION, CHRONIC 06/04/2007    Qualifier: Diagnosis of  By: Sherren Mocha MD, Jory Ee   . Headache(784.0) 02/26/2010    Qualifier: Diagnosis of  By: Regis Bill MD, Standley Brooking   . Irregular menses     Dr Helane Rima  . Blood transfusion without reported diagnosis     at birth  . Dysmenorrhea    No Known Allergies Social History   Social History  . Marital Status: Single    Spouse Name: N/A  . Number of Children: N/A  . Years of Education: N/A   Occupational History  . Not on file.   Social History Main Topics  . Smoking status: Never Smoker   . Smokeless tobacco: Never Used  . Alcohol Use: No  . Drug Use: No  . Sexual Activity:    Partners: Male    Birth Control/ Protection: Condom   Other Topics Concern  . Not on file   Social History Narrative   hh of 4  With family   No pets   Went to [employer]  Going to [place] smokes.   But neg ets      Review of Systems Negative, with the exception of above mentioned in HPI    Objective:   Physical Exam BP 119/77 mmHg  Pulse 85  Temp(Src) 97 F (36.1 C) (Oral)  Resp 18  Wt 156 lb 6.4 oz (70.943 kg)  SpO2 99%  LMP  11/01/2014 Gen: Afebrile. No acute distress. Nontoxic in appearance, well-developed, well-nourished, Caucasian female. CV: RRR no murmur appreciated Psych: Normal affect, dress and demeanor. Normal speech. Normal thought content and judgment..     Assessment & Plan:  1. Generalized anxiety disorder - Patient is responding well to Effexor, feeling positive side effects with no negative side effects. Continue with 75 mg daily. - venlafaxine XR (EFFEXOR-XR) 75 MG 24 hr capsule; 37.5 mg daily for 5 days, then 75 mg daily  Dispense: 90 capsule; Refill: 0 - Follow-up in 3 months, or sooner if needed.

## 2014-12-02 NOTE — Patient Instructions (Signed)
If you need anything call in to be scheduled otherwise I will see you in 3 months.

## 2014-12-16 ENCOUNTER — Encounter: Payer: Self-pay | Admitting: Family Medicine

## 2014-12-16 ENCOUNTER — Ambulatory Visit (INDEPENDENT_AMBULATORY_CARE_PROVIDER_SITE_OTHER): Payer: BLUE CROSS/BLUE SHIELD | Admitting: Family Medicine

## 2014-12-16 VITALS — BP 128/81 | HR 78 | Temp 98.1°F | Resp 16 | Ht 63.75 in | Wt 156.0 lb

## 2014-12-16 DIAGNOSIS — J029 Acute pharyngitis, unspecified: Secondary | ICD-10-CM

## 2014-12-16 DIAGNOSIS — J020 Streptococcal pharyngitis: Secondary | ICD-10-CM

## 2014-12-16 MED ORDER — AMOXICILLIN 500 MG PO CAPS: 500.0000 mg | ORAL_CAPSULE | Freq: Three times a day (TID) | ORAL | Status: DC

## 2014-12-16 NOTE — Patient Instructions (Signed)
Rest, Hydrate, advil/tylenol headache, chloraseptic. Antibiotic for 10 days Return if no improvement 1 week or worsening symptoms.    Strep Throat Strep throat is an infection of the throat caused by a bacteria named Streptococcus pyogenes. Your health care provider may call the infection streptococcal "tonsillitis" or "pharyngitis" depending on whether there are signs of inflammation in the tonsils or back of the throat. Strep throat is most common in children aged 5-15 years during the cold months of the year, but it can occur in people of any age during any season. This infection is spread from person to person (contagious) through coughing, sneezing, or other close contact. SIGNS AND SYMPTOMS   Fever or chills.  Painful, swollen, red tonsils or throat.  Pain or difficulty when swallowing.  White or yellow spots on the tonsils or throat.  Swollen, tender lymph nodes or "glands" of the neck or under the jaw.  Red rash all over the body (rare). DIAGNOSIS  Many different infections can cause the same symptoms. A test must be done to confirm the diagnosis so the right treatment can be given. A "rapid strep test" can help your health care provider make the diagnosis in a few minutes. If this test is not available, a light swab of the infected area can be used for a throat culture test. If a throat culture test is done, results are usually available in a day or two. TREATMENT  Strep throat is treated with antibiotic medicine. HOME CARE INSTRUCTIONS   Gargle with 1 tsp of salt in 1 cup of warm water, 3-4 times per day or as needed for comfort.  Family members who also have a sore throat or fever should be tested for strep throat and treated with antibiotics if they have the strep infection.  Make sure everyone in your household washes their hands well.  Do not share food, drinking cups, or personal items that could cause the infection to spread to others.  You may need to eat a soft food  diet until your sore throat gets better.  Drink enough water and fluids to keep your urine clear or pale yellow. This will help prevent dehydration.  Get plenty of rest.  Stay home from school, day care, or work until you have been on antibiotics for 24 hours.  Take medicines only as directed by your health care provider.  Take your antibiotic medicine as directed by your health care provider. Finish it even if you start to feel better. SEEK MEDICAL CARE IF:   The glands in your neck continue to enlarge.  You develop a rash, cough, or earache.  You cough up green, yellow-brown, or bloody sputum.  You have pain or discomfort not controlled by medicines.  Your problems seem to be getting worse rather than better.  You have a fever. SEEK IMMEDIATE MEDICAL CARE IF:   You develop any new symptoms such as vomiting, severe headache, stiff or painful neck, chest pain, shortness of breath, or trouble swallowing.  You develop severe throat pain, drooling, or changes in your voice.  You develop swelling of the neck, or the skin on the neck becomes red and tender.  You develop signs of dehydration, such as fatigue, dry mouth, and decreased urination.  You become increasingly sleepy, or you cannot wake up completely. MAKE SURE YOU:  Understand these instructions.  Will watch your condition.  Will get help right away if you are not doing well or get worse. Document Released: 03/22/2000 Document Revised: 08/09/2013  Document Reviewed: 05/24/2010 [facility] Patient Information 2015 [employer]. This information is not intended to replace advice given to you by your health care provider. Make sure you discuss any questions you have with your health care provider.

## 2014-12-16 NOTE — Progress Notes (Signed)
Pre visit review using our clinic review tool, if applicable. No additional management support is needed unless otherwise documented below in the visit note. 

## 2014-12-16 NOTE — Progress Notes (Signed)
   Subjective:    Patient ID: Angela Jordan, female    DOB: 15-Jul-1990, 24 y.o.   MRN: [id number]  HPI  Sore throat: Patient presents with a 4-5 day history of fever, chills, congestion, midline frontal facial pressure, sore throat  And a sensation that feels like a " golf ball in her throat". 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 She her father was also sick last week but she does not think it was with strep throat. She states it hurts for her to eat, but she is tolerating food and liquids on her stomach. She has been taking Mucinex, and a fever reducer. She feels that this is the third episode of tonsillitis in a year for her.   Past Medical History  Diagnosis Date  . Constipation   . CONSTIPATION, CHRONIC 06/04/2007    Qualifier: Diagnosis of  By: Sherren Mocha MD, Jory Ee   . Headache(784.0) 02/26/2010    Qualifier: Diagnosis of  By: Regis Bill MD, Standley Brooking   . Irregular menses     Dr Helane Rima  . Blood transfusion without reported diagnosis     at birth  . Dysmenorrhea    Social History   Social History  . Marital Status: Single    Spouse Name: N/A  . Number of Children: N/A  . Years of Education: N/A   Occupational History  . Not on file.   Social History Main Topics  . Smoking status: Never Smoker   . Smokeless tobacco: Never Used  . Alcohol Use: No  . Drug Use: No  . Sexual Activity:    Partners: Male    Birth Control/ Protection: Condom   Other Topics Concern  . Not on file   Social History Narrative   hh of 4  With family   No pets   Went to [employer]  Going to [place] smokes.   But neg ets      Review of Systems Negative, with the exception of above mentioned in HPI     Objective:   Physical Exam BP 128/81 mmHg  Pulse 78  Temp(Src) 98.1 F (36.7 C) (Oral)  Resp 16  Ht 5' 3.75" (1.619 m)  Wt 156 lb (70.761 kg)  BMI 27.00 kg/m2  SpO2 98%  LMP 12/11/2014 Gen: Afebrile. No acute distress. Nontoxic in appearance,  well-developed, well-nourished Caucasian female. Appears fatigued. HENT: AT. [place]. Bilateral TM shiny and full. Bilateral eyes without injections or icterus. MMM. Bilateral nares no erythema, mild swelling. Throat with erythema and exudates left greater than right tonsil, and large tonsils bilaterally. Eyes:Pupils Equal Round Reactive to light, Extraocular movements intact,  Conjunctiva without redness, discharge or icterus. Neck/Lymph: Supple, bilateral lower cervical anterior lymphadenopathy CV: RRR no murmur appreciated Chest: CTAB, no wheeze or crackles Abd: Soft. Flat. NTND. BS present. No Masses palpated.  Skin: No rashes, purpura or petechiae.       Assessment & Plan:  1. Streptococcal sore throat - POCT rapid strep A negative, patient with copious exudates left greater than right tonsil, with bilateral enlarged tonsils and erythema. Will treat and send throat culture.  - DDX: Mono, yeast esophagitis.  - Throat culture ([facility]) - amoxicillin (AMOXIL) 500 MG capsule; Take 1 capsule (500 mg total) by mouth 3 (three) times daily.  Dispense: 30 capsule; Refill: 0 - Hydrate, Chloraseptic, salt water), Advil/Tylenol for fever, take antibiotics to completion. F/u 1 week if no improvement.

## 2014-12-18 LAB — CULTURE, GROUP A STREP: Organism ID, Bacteria: NORMAL

## 2014-12-22 ENCOUNTER — Telehealth: Payer: Self-pay | Admitting: Family Medicine

## 2014-12-22 NOTE — Telephone Encounter (Signed)
Please advise. Thanks.  

## 2014-12-22 NOTE — Telephone Encounter (Signed)
Pt called and says the amocicillin is making her sick and she also feels it has given her a yeast infection.  Pt states she still has a cough. Please advise her what she should do.

## 2014-12-23 MED ORDER — FLUCONAZOLE 150 MG PO TABS: 150.0000 mg | ORAL_TABLET | Freq: Once | ORAL | Status: DC

## 2014-12-23 MED ORDER — BENZONATATE 100 MG PO CAPS: 200.0000 mg | ORAL_CAPSULE | Freq: Two times a day (BID) | ORAL | Status: DC | PRN

## 2014-12-23 NOTE — Telephone Encounter (Signed)
LMOM for pt to CB.  

## 2014-12-23 NOTE — Telephone Encounter (Signed)
She needs to continue the antibiotic to completion if possible. She can try to take with a meal, and cut back to two times a day until she reaches day ten of abx tx. The cough can stay for weeks after infection and she can take OTC cough syrup for this, I have called in tessalon perles for cough (if hse desires to try) and diflucan for yeast infection.

## 2014-12-23 NOTE — Telephone Encounter (Signed)
Patient aware.  She states she has been taking it with a meal but will try cutting to BID to see if that helps.  She was also made aware to p/u diflucan and tesalon pearls. Pt had no questions at this time.

## 2015-01-09 ENCOUNTER — Ambulatory Visit (INDEPENDENT_AMBULATORY_CARE_PROVIDER_SITE_OTHER): Payer: BLUE CROSS/BLUE SHIELD | Admitting: Family Medicine

## 2015-01-09 ENCOUNTER — Encounter: Payer: Self-pay | Admitting: Family Medicine

## 2015-01-09 VITALS — BP 120/76 | HR 74 | Temp 98.4°F | Resp 18 | Ht 63.75 in | Wt 152.0 lb

## 2015-01-09 DIAGNOSIS — J039 Acute tonsillitis, unspecified: Secondary | ICD-10-CM

## 2015-01-09 DIAGNOSIS — J029 Acute pharyngitis, unspecified: Secondary | ICD-10-CM

## 2015-01-09 DIAGNOSIS — Z23 Encounter for immunization: Secondary | ICD-10-CM

## 2015-01-09 LAB — POCT RAPID STREP A (OFFICE): Rapid Strep A Screen: NEGATIVE

## 2015-01-09 NOTE — Progress Notes (Addendum)
   Subjective:    Patient ID: Angela Jordan, female    DOB: 09/15/1990, 24 y.o.   MRN: [id number]  HPI  Sore throat: Patient presents for acute offices of her one-day history of sore throat with bilateral ear pain. Patient denies tinnitus, hearing loss, ear drainage, eye pain or drainage, congestion  , cough , rash, nausea, vomiting or diarrhea. Patient states that her symptoms did improve after last treatment of strep throat, however she was around her father a few days ago when he was ill. She is afraid that she contacted strep throat a can. Patient has had multiple tonsillitis/pharyngitis episodes within the last year, and she is wondering if she needs to have her tonsils removed. Patient is eating and drinking well, patient states that she does not snore to her knowledge.   Past Medical History  Diagnosis Date  . Constipation   . CONSTIPATION, CHRONIC 06/04/2007    Qualifier: Diagnosis of  By: Sherren Mocha MD, Jory Ee   . Headache(784.0) 02/26/2010    Qualifier: Diagnosis of  By: Regis Bill MD, Standley Brooking   . Irregular menses     Dr Helane Rima  . Blood transfusion without reported diagnosis     at birth  . Dysmenorrhea    No Known Allergies Social History   Social History  . Marital Status: Single    Spouse Name: N/A  . Number of Children: N/A  . Years of Education: N/A   Occupational History  . Not on file.   Social History Main Topics  . Smoking status: Never Smoker   . Smokeless tobacco: Never Used  . Alcohol Use: No  . Drug Use: No  . Sexual Activity:    Partners: Male    Birth Control/ Protection: Condom   Other Topics Concern  . Not on file   Social History Narrative   hh of 4  With family   No pets   Went to [employer]  Going to [place] smokes.   But neg ets     Review of Systems Negative, with the exception of above mentioned in HPI     Objective:   Physical Exam BP 120/76 mmHg  Pulse 74  Temp(Src) 98.4 F (36.9 C) (Temporal)  Resp 18  Ht 5'  3.75" (1.619 m)  Wt 152 lb (68.947 kg)  BMI 26.30 kg/m2  SpO2 99%  LMP 12/11/2014 Gen: Afebrile. No acute distress. Nontoxic in appearance, well-developed, well-nourished, Caucasian female. Appears well today. Makes good eye contact. HENT: AT. [place]. Bilateral TM visualized and normal in appearance. MMM. Bilateral nares with mild erythema, mild swelling. Throat with mild erythema, bilaterally enlarged tonsils, no exudates. Eyes:Pupils Equal Round Reactive to light, Extraocular movements intact,  Conjunctiva without redness, discharge or icterus. Neck/lymp/endocrine: Supple, no lymphadenopathy CV: RRR  Chest: CTAB, no wheeze or crackles    Assessment & Plan:  1. Sore throat - POCT rapid strep A--> negative - Likely viral in nature, no exudates present, enlarged tonsils present.   2. Tonsillitis - recurrent tonsillitis multiple times this year. Discussed ENT referral for discussion on tonsillectomy.  - Ambulatory referral to ENT  3. Flu shot administered today Follow-up when necessary

## 2015-01-09 NOTE — Patient Instructions (Signed)
Tonsillectomy A tonsillectomy is a surgery to remove your tonsils. Tonsils are lymph tissues at the back and upper part of your throat. Because tonsils can collect debris, they can become infected. Tonsillectomy often is done when nonsurgical treatments have been unsuccessful in resolving problems with tonsils.  LET [facility] CARE PROVIDER KNOW ABOUT:  Any allergies you have.  All medicines you are taking, including vitamins, herbs, eye drops, creams, and over-the-counter medicines, especially those containing aspirin or ibuprofen.  Previous problems you or members of your family have had with the use of anesthetics.  Any blood disorders you have.  Previous surgeries you have had.  Medical conditions you have.  Recent cough or fever you have had. RISKS AND COMPLICATIONS Generally, tonsillectomy is a safe procedure. However, as with any procedure, problems can occur. Possible problems include:  Bleeding.  Infection.  Scarring.  Changes in your sense of taste.  Changes in your voice.  Changes in swallowing. BEFORE THE PROCEDURE  Do not take any aspirin, ibuprofen, or any medicines that may contain these agents for 2 weeks before the procedure.  Do not eat or drink after midnight the night before the procedure. PROCEDURE   You will be given a medicine that makes you go to sleep (general anesthetic).  A device will be placed inside of your mouth that presses your tongue down so that the tonsils at the back of your throat can be removed without cuts on the outside of your neck or throat.  Your tonsils will typically be removed with a device called an electrocautery, which will cut your tonsils out and then shrink the surrounding blood vessels at the same time so that you will not bleed after the procedure.  Usually, stitches will not be used to close the cut. A white scab (eschar) will form in the area where your tonsils used to be. AFTER THE PROCEDURE After surgery, you  will be taken to a recovery area for close monitoring. Once you are awake, stable, and taking fluids well, you will be allowed to go home.  Document Released: 07/07/2000 Document Revised: 03/30/2013 Document Reviewed: 10/20/2012 [facility] Patient Information 2015 [place], [place]. This information is not intended to replace advice given to you by your health care provider. Make sure you discuss any questions you have with your health care provider.   Tonsillitis Tonsillitis is an infection of the throat that causes the tonsils to become red, tender, and swollen. Tonsils are collections of lymphoid tissue at the back of the throat. Each tonsil has crevices (crypts). Tonsils help fight nose and throat infections and keep infection from spreading to other parts of the body for the first 18 months of life.  CAUSES Sudden (acute) tonsillitis is usually caused by infection with streptococcal bacteria. Long-lasting (chronic) tonsillitis occurs when the crypts of the tonsils become filled with pieces of food and bacteria, which makes it easy for the tonsils to become repeatedly infected. SYMPTOMS  Symptoms of tonsillitis include:  A sore throat, with possible difficulty swallowing.  White patches on the tonsils.  Fever.  Tiredness.  New episodes of snoring during sleep, when you did not snore before.  Small, foul-smelling, yellowish-white pieces of material (tonsilloliths) that you occasionally cough up or spit out. The tonsilloliths can also cause you to have bad breath. DIAGNOSIS Tonsillitis can be diagnosed through a physical exam. Diagnosis can be confirmed with the results of lab tests, including a throat culture. TREATMENT  The goals of tonsillitis treatment include the reduction of  the severity and duration of symptoms and prevention of associated conditions. Symptoms of tonsillitis can be improved with the use of steroids to reduce the swelling. Tonsillitis caused by bacteria can be treated  with antibiotic medicines. Usually, treatment with antibiotic medicines is started before the cause of the tonsillitis is known. However, if it is determined that the cause is not bacterial, antibiotic medicines will not treat the tonsillitis. If attacks of tonsillitis are severe and frequent, your health care provider may recommend surgery to remove the tonsils (tonsillectomy). HOME CARE INSTRUCTIONS   Rest as much as possible and get plenty of sleep.  Drink plenty of fluids. While the throat is very sore, eat soft foods or liquids, such as sherbet, soups, or instant breakfast drinks.  Eat frozen ice pops.  Gargle with a warm or cold liquid to help soothe the throat. Mix 1/4 teaspoon of salt and 1/4 teaspoon of baking soda in 8 oz of water. SEEK MEDICAL CARE IF:   Large, tender lumps develop in your neck.  A rash develops.  A green, yellow-brown, or bloody substance is coughed up.  You are unable to swallow liquids or food for 24 hours.  You notice that only one of the tonsils is swollen. SEEK IMMEDIATE MEDICAL CARE IF:   You develop any new symptoms such as vomiting, severe headache, stiff neck, chest pain, or trouble breathing or swallowing.  You have severe throat pain along with drooling or voice changes.  You have severe pain, unrelieved with recommended medications.  You are unable to fully open the mouth.  You develop redness, swelling, or severe pain anywhere in the neck.  You have a fever. MAKE SURE YOU:   Understand these instructions.  Will watch your condition.  Will get help right away if you are not doing well or get worse. Document Released: 01/02/2005 Document Revised: 08/09/2013 Document Reviewed: 09/11/2012 [facility] Patient Information 2015 [place], [place]. This information is not intended to replace advice given to you by your health care provider. Make sure you discuss any questions you have with your health care provider.

## 2015-01-09 NOTE — Progress Notes (Signed)
Pre visit review using our clinic review tool, if applicable. No additional management support is needed unless otherwise documented below in the visit note. 

## 2015-02-07 ENCOUNTER — Ambulatory Visit (INDEPENDENT_AMBULATORY_CARE_PROVIDER_SITE_OTHER): Payer: BLUE CROSS/BLUE SHIELD | Admitting: Family Medicine

## 2015-02-07 ENCOUNTER — Encounter: Payer: Self-pay | Admitting: Family Medicine

## 2015-02-07 VITALS — BP 118/76 | HR 74 | Temp 98.0°F | Resp 20 | Wt 154.8 lb

## 2015-02-07 DIAGNOSIS — F411 Generalized anxiety disorder: Secondary | ICD-10-CM | POA: Diagnosis not present

## 2015-02-07 MED ORDER — VENLAFAXINE HCL ER 150 MG PO CP24: ORAL_CAPSULE | ORAL | Status: DC

## 2015-02-07 NOTE — Patient Instructions (Signed)
I have increased your dose to effexor to 150 mg daily.  followup in 4 weeks if not feeling better or having side effect,otherwise follow-up in 3 months

## 2015-02-08 NOTE — Progress Notes (Signed)
   Subjective:    Patient ID: Angela Jordan, female    DOB: 1990-12-07, 24 y.o.   MRN: [id number]  HPI  Anxiety: Patient presents for follow-up on her anxiety. She states that initially when we started the Effexor taper to 75 mg she was doing well, but over the last few weeks she has noticed that she doesn't feel she skin has much benefit her anxiety is increasing. Patient denies any new onset of stressors. Still working and going to college. She denies any negative side effects from Effexor use.  Past Medical History  Diagnosis Date  . Constipation   . CONSTIPATION, CHRONIC 06/04/2007    Qualifier: Diagnosis of  By: Sherren Mocha MD, Jory Ee   . Headache(784.0) 02/26/2010    Qualifier: Diagnosis of  By: Regis Bill MD, Standley Brooking   . Irregular menses     Dr Helane Rima  . Blood transfusion without reported diagnosis     at birth  . Dysmenorrhea    No Known Allergies Social History   Social History  . Marital Status: Single    Spouse Name: N/A  . Number of Children: N/A  . Years of Education: N/A   Occupational History  . Not on file.   Social History Main Topics  . Smoking status: Never Smoker   . Smokeless tobacco: Never Used  . Alcohol Use: No  . Drug Use: No  . Sexual Activity:    Partners: Male    Birth Control/ Protection: Condom   Other Topics Concern  . Not on file   Social History Narrative   hh of 4  With family   No pets   Went to [employer]  Going to [place] smokes.   But neg ets     Review of Systems Negative, with the exception of above mentioned in HPI     Objective:   Physical Exam BP 118/76 mmHg  Pulse 74  Temp(Src) 98 F (36.7 C) (Oral)  Resp 20  Wt 154 lb 12 oz (70.194 kg)  SpO2 95% Gen: Afebrile. No acute distress. Nontoxic in appearance, well-developed,  CV: RRR Chest: CTAB, no wheeze or crackles Psych: Normal affect, dress and demeanor. Normal speech. Normal thought content and judgment..     Assessment & Plan:  1. Generalized  anxiety disorder - Chest with patient today her increased in anxiety. We have decided to increase her Effexor to 150 mg daily. Patient is to follow-up in 4 weeks, if no improvement, otherwise will follow-up in 3 months. - venlafaxine XR (EFFEXOR-XR) 150 MG 24 hr capsule; Take 150 mg QD  Dispense: 30 capsule; Refill: 2

## 2015-04-21 ENCOUNTER — Ambulatory Visit: Payer: BLUE CROSS/BLUE SHIELD | Admitting: Obstetrics and Gynecology

## 2015-04-21 ENCOUNTER — Telehealth: Payer: Self-pay | Admitting: Obstetrics and Gynecology

## 2015-04-21 NOTE — Telephone Encounter (Signed)
Thanks for the message Alinda Sierras! Encounter closed.

## 2015-04-21 NOTE — Telephone Encounter (Signed)
Patient started her cycle and canceled her aex today,  rescheduled to 04/24/15 at 11:00am.

## 2015-04-24 ENCOUNTER — Ambulatory Visit: Payer: BLUE CROSS/BLUE SHIELD | Admitting: Obstetrics and Gynecology

## 2015-04-24 ENCOUNTER — Telehealth: Payer: Self-pay | Admitting: Obstetrics and Gynecology

## 2015-04-24 NOTE — Telephone Encounter (Signed)
Patient called to reschedule appointment for today due to her starting cycle and it being heavy. Patient reschedule to 05/10/15 with Dr. Quincy Simmonds.

## 2015-04-24 NOTE — Telephone Encounter (Signed)
This appointment is for the patient's Angela Jordan. Routing to Dr.Silva as FYI. Will close encounter.

## 2015-05-10 ENCOUNTER — Ambulatory Visit (INDEPENDENT_AMBULATORY_CARE_PROVIDER_SITE_OTHER): Payer: BLUE CROSS/BLUE SHIELD | Admitting: Obstetrics and Gynecology

## 2015-05-10 ENCOUNTER — Encounter: Payer: Self-pay | Admitting: Obstetrics and Gynecology

## 2015-05-10 VITALS — BP 110/82 | HR 66 | Resp 20 | Ht 63.0 in | Wt 158.0 lb

## 2015-05-10 DIAGNOSIS — Z0000 Encounter for general adult medical examination without abnormal findings: Secondary | ICD-10-CM

## 2015-05-10 DIAGNOSIS — Z01419 Encounter for gynecological examination (general) (routine) without abnormal findings: Secondary | ICD-10-CM

## 2015-05-10 DIAGNOSIS — Z113 Encounter for screening for infections with a predominantly sexual mode of transmission: Secondary | ICD-10-CM | POA: Diagnosis not present

## 2015-05-10 LAB — POCT URINALYSIS DIPSTICK
BILIRUBIN UA: NEGATIVE
Glucose, UA: NEGATIVE
KETONES UA: NEGATIVE
Nitrite, UA: NEGATIVE
PH UA: 5
Protein, UA: NEGATIVE
Urobilinogen, UA: NEGATIVE

## 2015-05-10 NOTE — Progress Notes (Signed)
Patient ID: Angela Jordan, female   DOB: 01-10-1991, 25 y.o.   MRN: [id number] 25 y.o. [id number] Single Caucasian female here for annual exam.    Wants to get a job at [employer].   Has Nexplanon.  No menses from Sept. To December.  Then cycle started at of December and lasted for 3 weeks.  Felt stress with school, and is now taking a break.  Patient wants STD testing.  Had Gardasil - all 3 vaccines.   PCP:  Rosina Lowenstein, MD  LMP was 03/31/17.      Sexually active: Yes.   female.  Has new partner.  Using condoms.  The current method of family planning is Nexplanon in left arm--inserted 08-31-14.    Exercising: No.   Smoker:  no  Health Maintenance: Pap:  04-18-14 ASCUS:Neg History of abnormal Pap:  no MMG:  n/a Colonoscopy:  n/a BMD:   n/a  Result  n/a TDaP:  02-12-12 Screening Labs:  Hb today: 13.1, Urine today: 1+WBC, 1+RBCs(insufficient quantity to send).  Voided specimen.  No dysuria.    reports that she has never smoked. She has never used smokeless tobacco. She reports that she does not drink alcohol or use illicit drugs.  Past Medical History  Diagnosis Date  . Constipation   . CONSTIPATION, CHRONIC 06/04/2007    Qualifier: Diagnosis of  By: Sherren Mocha MD, Jory Ee   . Headache(784.0) 02/26/2010    Qualifier: Diagnosis of  By: Regis Bill MD, Standley Brooking   . Irregular menses     Dr Helane Rima  . Blood transfusion without reported diagnosis     at birth  . Dysmenorrhea     Past Surgical History  Procedure Laterality Date  . Wisdom tooth extraction      Current Outpatient Prescriptions  Medication Sig Dispense Refill  . etonogestrel (NEXPLANON) 68 MG IMPL implant 1 each by Subdermal route once.     No current facility-administered medications for this visit.    Family History  Problem Relation Age of Onset  . Hypertension Father   . Diabetes Maternal Aunt   . Thyroid disease Paternal Aunt   . Diabetes Maternal Grandmother   . Heart disease Maternal Grandmother    . Leukemia Maternal Grandfather   . Breast cancer Paternal Grandmother   . Lymphoma Paternal Grandmother     ROS:  Pertinent items are noted in HPI.  Otherwise, a comprehensive ROS was negative.  Exam:   BP 110/82 mmHg  Pulse 66  Resp 20  Ht 5\' 3"  (1.6 m)  Wt 158 lb (71.668 kg)  BMI 28.00 kg/m2  LMP 04/01/2015    General appearance: alert, cooperative and appears stated age Head: Normocephalic, without obvious abnormality, atraumatic Neck: no adenopathy, supple, symmetrical, trachea midline and thyroid normal to inspection and palpation Lungs: clear to auscultation bilaterally Breasts: normal appearance, no masses or tenderness, Inspection negative, No nipple retraction or dimpling, No nipple discharge or bleeding, No axillary or supraclavicular adenopathy Heart: regular rate and rhythm Abdomen: soft, non-tender; bowel sounds normal; no masses,  no organomegaly Extremities: extremities normal, atraumatic, no cyanosis or edema.  Nexplanon palpable and intact in left arm.  Skin: Skin color, texture, turgor normal. No rashes or lesions Lymph nodes: Cervical, supraclavicular, and axillary nodes normal. No abnormal inguinal nodes palpated Neurologic: Grossly normal  Pelvic: External genitalia:  no lesions              Urethra:  normal appearing urethra with no masses, tenderness or lesions  Bartholins and Skenes: normal                 Vagina: normal appearing vagina with normal color and discharge, no lesions              Cervix: no lesions              Pap taken: No. Bimanual Exam:  Uterus:  normal size, contour, position, consistency, mobility, non-tender              Adnexa: normal adnexa and no mass, fullness, tenderness              Rectovaginal: No..  Confirms.              Anus:  normal sphincter tone, no lesions  Chaperone was present for exam.  Assessment:   Well woman visit with normal exam. Desire for STD testing.  Nexplanon patient.  Plan: Yearly  mammogram recommended after age 36.  Recommended self breast exam.  Pap and HR HPV as above. Discussed Calcium, Vitamin D, regular exercise program including cardiovascular and weight bearing exercise. Labs performed.  Yes.  .   See orders.  STD testing.  Refills given on medications.  No..    Follow up annually and prn.     After visit summary provided.

## 2015-05-10 NOTE — Patient Instructions (Signed)

## 2015-05-11 LAB — HEPATITIS C ANTIBODY: HCV AB: NEGATIVE

## 2015-05-11 LAB — HEMOGLOBIN, FINGERSTICK: HEMOGLOBIN, FINGERSTICK: 13.1 g/dL (ref 12.0–16.0)

## 2015-05-11 LAB — STD PANEL
HIV: NONREACTIVE
Hepatitis B Surface Ag: NEGATIVE

## 2015-05-12 LAB — IPS N GONORRHOEA AND CHLAMYDIA BY PCR

## 2015-08-10 ENCOUNTER — Telehealth: Payer: Self-pay | Admitting: Family Medicine

## 2015-08-10 NOTE — Telephone Encounter (Signed)
Patient called back. Okay for her mother to pick up results.

## 2015-08-10 NOTE — Telephone Encounter (Signed)
Patient needs the result from TB skin test. She would like for her mother to pick it up. Advised patient's mother that patient will need to call us to authorize her mother to pick it up. She will have her daughter call

## 2015-08-10 NOTE — Telephone Encounter (Signed)
Letter from Aug 2016 printed. P/u by pts mother pt Angela Jordan.

## 2015-08-31 ENCOUNTER — Ambulatory Visit (INDEPENDENT_AMBULATORY_CARE_PROVIDER_SITE_OTHER): Payer: BLUE CROSS/BLUE SHIELD | Admitting: Family Medicine

## 2015-08-31 ENCOUNTER — Encounter: Payer: Self-pay | Admitting: Family Medicine

## 2015-08-31 VITALS — BP 109/73 | HR 87 | Temp 98.0°F | Resp 20 | Wt 153.0 lb

## 2015-08-31 DIAGNOSIS — R109 Unspecified abdominal pain: Secondary | ICD-10-CM | POA: Diagnosis not present

## 2015-08-31 DIAGNOSIS — K589 Irritable bowel syndrome without diarrhea: Secondary | ICD-10-CM | POA: Diagnosis not present

## 2015-08-31 LAB — COMPREHENSIVE METABOLIC PANEL
ALBUMIN: 4.8 g/dL (ref 3.5–5.2)
ALT: 9 U/L (ref 0–35)
AST: 11 U/L (ref 0–37)
Alkaline Phosphatase: 71 U/L (ref 39–117)
BILIRUBIN TOTAL: 0.5 mg/dL (ref 0.2–1.2)
BUN: 11 mg/dL (ref 6–23)
CALCIUM: 10.2 mg/dL (ref 8.4–10.5)
CO2: 26 mEq/L (ref 19–32)
CREATININE: 0.72 mg/dL (ref 0.40–1.20)
Chloride: 106 mEq/L (ref 96–112)
GFR: 105.12 mL/min (ref >60.00)
Glucose, Bld: 85 mg/dL (ref 70–99)
Potassium: 4.1 mEq/L (ref 3.5–5.1)
Sodium: 138 mEq/L (ref 135–145)
Total Protein: 7.6 g/dL (ref 6.0–8.3)

## 2015-08-31 LAB — CBC WITH DIFFERENTIAL/PLATELET
BASOS PCT: 0.1 % (ref 0.0–3.0)
Basophils Absolute: 0 10*3/uL (ref 0.0–0.1)
EOS ABS: 0.1 10*3/uL (ref 0.0–0.7)
EOS PCT: 1.2 % (ref 0.0–5.0)
HEMATOCRIT: 44.9 % (ref 36.0–46.0)
HEMOGLOBIN: 15 g/dL (ref 12.0–15.0)
LYMPHS PCT: 25.3 % (ref 12.0–46.0)
Lymphs Abs: 2 10*3/uL (ref 0.7–4.0)
MCHC: 33.3 g/dL (ref 30.0–36.0)
MCV: 88.8 fl (ref 78.0–100.0)
Monocytes Absolute: 0.5 10*3/uL (ref 0.1–1.0)
Monocytes Relative: 6.2 % (ref 3.0–12.0)
Neutro Abs: 5.3 10*3/uL (ref 1.4–7.7)
Neutrophils Relative %: 67.2 % (ref 43.0–77.0)
Platelets: 379 10*3/uL (ref 150.0–400.0)
RBC: 5.06 Mil/uL (ref 3.87–5.11)
RDW: 12.8 % (ref 11.5–15.5)
WBC: 7.9 10*3/uL (ref 4.0–10.5)

## 2015-08-31 LAB — SEDIMENTATION RATE: SED RATE: 13 mm/h (ref 0–20)

## 2015-08-31 LAB — C-REACTIVE PROTEIN: CRP: 0.1 mg/dL — AB (ref 0.5–20.0)

## 2015-08-31 LAB — H. PYLORI ANTIBODY, IGG: H Pylori IgG: NEGATIVE

## 2015-08-31 MED ORDER — DICYCLOMINE HCL 20 MG PO TABS: ORAL_TABLET | ORAL | Status: DC

## 2015-08-31 MED ORDER — ALIGN 4 MG PO CAPS: 1.0000 | ORAL_CAPSULE | Freq: Every day | ORAL | Status: DC

## 2015-08-31 NOTE — Patient Instructions (Signed)

## 2015-08-31 NOTE — Progress Notes (Signed)
Patient ID: Angela Jordan, female   DOB: 1990-08-17, 25 y.o.   MRN: [id number]    Angela Jordan , 04-11-1990, 25 y.o., female MRN: [id number]  CC: abdominal cramping  Subjective:  Abdominal cramping/IBS symptoms: Patient presents for initial office visit for daily abdominal cramping and IBS like symptoms that have occurred intermittently over a two-year period. She states it has come progressively worse since it started 2 years ago, she does experience cramping daily. She is to the point where it used to be just right after she would eat a meal she would have abdominal cramping that would improve with a bowel movement. She states now she is having symptoms more frequently and even prior to any meals. She endorses decreased appetite, weight loss secondary to nausea after eating and just not feeling hungry. She states she just decided to do something about it because it is affecting her daily. She denies any nighttime symptoms. She has had history of palms with constipation in the past, but states that all of her bowel movements are either normal to loose now. She feels that sometimes her cramping is a strong radiates to her back. SHe does admit to having a five-week menstrual cycle after having the Nexplanon, but this was placed one year ago after the GI symptoms had started. She endorses a brother has similar symptoms, no other family members affected by IBS or history of irritable bowel disease. She admits to more stress with her new job, but does not think it's anything more than she can handle.  No Known Allergies Social History  Substance Use Topics  . Smoking status: Never Smoker   . Smokeless tobacco: Never Used  . Alcohol Use: No   Past Medical History  Diagnosis Date  . Constipation   . CONSTIPATION, CHRONIC 06/04/2007    Qualifier: Diagnosis of  By: Sherren Mocha MD, Jory Ee   . Headache(784.0) 02/26/2010    Qualifier: Diagnosis of  By: Regis Bill MD, Standley Brooking   . Irregular menses     Dr  Helane Rima  . Blood transfusion without reported diagnosis     at birth  . Dysmenorrhea    Past Surgical History  Procedure Laterality Date  . Wisdom tooth extraction     Family History  Problem Relation Age of Onset  . Hypertension Father   . Diabetes Maternal Aunt   . Thyroid disease Paternal Aunt   . Diabetes Maternal Grandmother   . Heart disease Maternal Grandmother   . Leukemia Maternal Grandfather   . Breast cancer Paternal Grandmother   . Lymphoma Paternal Grandmother      Medication List       This list is accurate as of: 08/31/15 11:30 AM.  Always use your most recent med list.               fluconazole 150 MG tablet  Commonly known as:  DIFLUCAN  take 1 tablet by mouth immediately and repeat in 3 days     NEXPLANON 68 MG Impl implant  Generic drug:  etonogestrel  1 each by Subdermal route once.       ROS: Negative, with the exception of above mentioned in HPI  Objective:  BP 109/73 mmHg  Pulse 87  Temp(Src) 98 F (36.7 C)  Resp 20  Wt 153 lb (69.4 kg)  SpO2 97% Body mass index is 27.11 kg/(m^2). Gen: Afebrile. No acute distress. Nontoxic in appearance, well developed, well nourished. Caucasian female. Very pleasant.  HENT: AT. [place]. Bilateral  TM visualized and normal in appearance. MMM, no oral lesions.  Eyes:Pupils Equal Round Reactive to light, Extraocular movements intact,  Conjunctiva without redness, discharge or icterus. Neck/lymp/endocrine: Supple, No thyromegaly  Abd: Soft.flat. NTND. BS mildly hyperactive. No Masses palpated. No rebound or guarding.  Skin: No rashes, purpura or petechiae.  Neuro: Normal gait. PERLA. EOMi. Alert. Oriented x3  Psych: Normal affect, dress and demeanor. Normal speech. Normal thought content and judgment.   Assessment/Plan: Angela Jordan is a 25 y.o. female present for acute OV for  Abdominal cramping/IBS symptoms - discussed her symptoms sound consistent with IBS. Counseled on potential causes of IBS. Pt to  make a food/stress diary and record any potential triggers.  - labs today to r/o infectious, inflammatory or gluten issue.  - She is to avoid lactose containing products for 2 weeks to see if symptoms are resolved, then add back to if cramping occurs again since she is also starting bentyl. - CBC w/Diff--> if anemic likely related to menses issue on nexplanon.  - Comprehensive metabolic panel - Sed Rate (ESR) - C-reactive protein - Celiac Ab tTG DGP TIgA - H. pylori antibody, IgG - Align x8 weeks - Water: 80 ounces a day - dicyclomine (BENTYL) 20 MG tablet; 20 mg every 6 hours  Dispense: 120 tablet; Refill: 1 - F/U 4 weeks, if not improved after 2 weeks increase bentyl (pt to call). If still not improved and labs normal, consider amitriptyline.   electronically signed by:  Howard Pouch, DO  [place]

## 2015-09-03 LAB — CELIAC AB TTG DGP TIGA
Antigliadin Abs, IgA: 4 units (ref 0–19)
Gliadin IgG: 1 units (ref 0–19)
IgA/Immunoglobulin A, Serum: 270 mg/dL (ref 87–352)
Transglutaminase IgA: <2 U/mL (ref 0–3)

## 2015-09-05 ENCOUNTER — Telehealth: Payer: Self-pay | Admitting: Family Medicine

## 2015-09-05 NOTE — Telephone Encounter (Signed)
LMOM for pt to CB.  

## 2015-09-05 NOTE — Telephone Encounter (Signed)
Please call pt: - all her labs were normal.  - F/U as directed from last appt

## 2015-09-07 NOTE — Telephone Encounter (Signed)
Patient aware of normal lab results. Pt had no questions at this time.

## 2015-09-15 DIAGNOSIS — Z3202 Encounter for pregnancy test, result negative: Secondary | ICD-10-CM | POA: Diagnosis not present

## 2015-09-15 DIAGNOSIS — Z114 Encounter for screening for human immunodeficiency virus [HIV]: Secondary | ICD-10-CM | POA: Diagnosis not present

## 2015-09-15 DIAGNOSIS — Z30430 Encounter for insertion of intrauterine contraceptive device: Secondary | ICD-10-CM | POA: Diagnosis not present

## 2015-09-15 DIAGNOSIS — Z113 Encounter for screening for infections with a predominantly sexual mode of transmission: Secondary | ICD-10-CM | POA: Diagnosis not present

## 2015-09-18 DIAGNOSIS — Z3046 Encounter for surveillance of implantable subdermal contraceptive: Secondary | ICD-10-CM | POA: Diagnosis not present

## 2016-05-06 ENCOUNTER — Ambulatory Visit (INDEPENDENT_AMBULATORY_CARE_PROVIDER_SITE_OTHER): Payer: BLUE CROSS/BLUE SHIELD | Admitting: Family Medicine

## 2016-05-06 ENCOUNTER — Encounter: Payer: Self-pay | Admitting: Family Medicine

## 2016-05-06 VITALS — BP 111/60 | HR 67 | Temp 98.6°F | Resp 20 | Wt 151.8 lb

## 2016-05-06 DIAGNOSIS — J0100 Acute maxillary sinusitis, unspecified: Secondary | ICD-10-CM

## 2016-05-06 MED ORDER — AMOXICILLIN-POT CLAVULANATE 875-125 MG PO TABS: 1.0000 | ORAL_TABLET | Freq: Two times a day (BID) | ORAL | 0 refills | Status: DC

## 2016-05-06 NOTE — Patient Instructions (Signed)
Rest, hydrate, augmentin.  Mucinex and nasal saline. Try a humidifier in the your room at night.    Sinusitis, Adult Sinusitis is soreness and inflammation of your sinuses. Sinuses are hollow spaces in the bones around your face. They are located:  Around your eyes.  In the middle of your forehead.  Behind your nose.  In your cheekbones. Your sinuses and nasal passages are lined with a stringy fluid (mucus). Mucus normally drains out of your sinuses. When your nasal tissues get inflamed or swollen, the mucus can get trapped or blocked so air cannot flow through your sinuses. This lets bacteria, viruses, and funguses grow, and that leads to infection. Follow these instructions at home: Medicines  Take, use, or apply over-the-counter and prescription medicines only as told by your doctor. These may include nasal sprays.  If you were prescribed an antibiotic medicine, take it as told by your doctor. Do not stop taking the antibiotic even if you start to feel better. Hydrate and Humidify  Drink enough water to keep your pee (urine) clear or pale yellow.  Use a cool mist humidifier to keep the humidity level in your home above 50%.  Breathe in steam for 10-15 minutes, 3-4 times a day or as told by your doctor. You can do this in the bathroom while a hot shower is running.  Try not to spend time in cool or dry air. Rest  Rest as much as possible.  Sleep with your head raised (elevated).  Make sure to get enough sleep each night. General instructions  Put a warm, moist washcloth on your face 3-4 times a day or as told by your doctor. This will help with discomfort.  Wash your hands often with soap and water. If there is no soap and water, use hand sanitizer.  Do not smoke. Avoid being around people who are smoking (secondhand smoke).  Keep all follow-up visits as told by your doctor. This is important. Contact a doctor if:  You have a fever.  Your symptoms get  worse.  Your symptoms do not get better within 10 days. Get help right away if:  You have a very bad headache.  You cannot stop throwing up (vomiting).  You have pain or swelling around your face or eyes.  You have trouble seeing.  You feel confused.  Your neck is stiff.  You have trouble breathing. This information is not intended to replace advice given to you by your health care provider. Make sure you discuss any questions you have with your health care provider. Document Released: 09/11/2007 Document Revised: 11/19/2015 Document Reviewed: 01/18/2015 Elsevier Interactive Patient Education  2017 [employer].

## 2016-05-06 NOTE — Progress Notes (Signed)
Angela Jordan , May 29, 1990, 26 y.o., female MRN: [id number] Patient Care Team    Relationship Specialty Notifications Start End  Ma Hillock, DO PCP - General Family Medicine  11/15/14   Dian Queen, MD Attending Physician Obstetrics and Gynecology  02/01/12     CC: cough  Subjective: Pt presents for an acute OV with complaints of cough of 1 week duration.  Associated symptoms include nasal congestion, sore throat, facial pressure. She denies fever, chills, nausea, vomit or diarrhea. Pt has tried Nyquil, dayquil and mucinex without good resolution. Sh estates her symptoms have continue to progress.  Pt did have her flu shot this year through her work.   No Known Allergies Social History  Substance Use Topics  . Smoking status: Never Smoker  . Smokeless tobacco: Never Used  . Alcohol use No   Past Medical History:  Diagnosis Date  . Blood transfusion without reported diagnosis    at birth  . Constipation   . CONSTIPATION, CHRONIC 06/04/2007   Qualifier: Diagnosis of  By: Sherren Mocha MD, Jory Ee   . Dysmenorrhea   . Headache(784.0) 02/26/2010   Qualifier: Diagnosis of  By: Regis Bill MD, Standley Brooking   . Irregular menses    Dr Helane Rima   Past Surgical History:  Procedure Laterality Date  . WISDOM TOOTH EXTRACTION     Family History  Problem Relation Age of Onset  . Hypertension Father   . Diabetes Maternal Aunt   . Thyroid disease Paternal Aunt   . Diabetes Maternal Grandmother   . Heart disease Maternal Grandmother   . Leukemia Maternal Grandfather   . Breast cancer Paternal Grandmother   . Lymphoma Paternal Grandmother    Allergies as of 05/06/2016   No Known Allergies     Medication List       Accurate as of 05/06/16  1:53 PM. Always use your most recent med list.          levonorgestrel 20 MCG/24HR IUD Commonly known as:  MIRENA 1 each by Intrauterine route once.       No results found for this or any previous visit (from the past 24 hour(s)). No results  found.   ROS: Negative, with the exception of above mentioned in HPI   Objective:  BP 111/60 (BP Location: Left Arm, Patient Position: Sitting, Cuff Size: Normal)   Pulse 67   Temp 98.6 F (37 C)   Resp 20   Wt 151 lb 12 oz (68.8 kg)   SpO2 100%   BMI 26.88 kg/m  Body mass index is 26.88 kg/m. Gen: Afebrile. No acute distress. Nontoxic in appearance, well developed, well nourished.  HENT: AT. [place]. Bilateral TM visualized left air fluid levle. MMM, no oral lesions. Bilateral nares with erythema, swelling, drainage. Throat without erythema or exudates. No cough, no hoarseness. Mild TTP max sinus.  Eyes:Pupils Equal Round Reactive to light, Extraocular movements intact,  Conjunctiva without redness, discharge or icterus. Neck/lymp/endocrine: Supple,bilateral ant cervical lymphadenopathy CV: RRR  Chest: CTAB, no wheeze or crackles. Normal WOB.  Abd: Soft. NTND. BS present.   Assessment/Plan: Angela Jordan is a 26 y.o. female present for acute OV for   Acute maxillary sinusitis, recurrence not specified - amoxicillin-clavulanate (AUGMENTIN) 875-125 MG tablet; Take 1 tablet by mouth 2 (two) times daily.  Dispense: 20 tablet; Refill: 0 Rest, hydrate.  Mucinex. Nasal saline.  Augmentin BID  - F/U PRN     electronically signed by:  Howard Pouch, DO  [facility]

## 2016-05-09 DIAGNOSIS — H5213 Myopia, bilateral: Secondary | ICD-10-CM | POA: Diagnosis not present

## 2016-05-13 ENCOUNTER — Telehealth: Payer: Self-pay | Admitting: Family Medicine

## 2016-05-13 MED ORDER — FLUCONAZOLE 150 MG PO TABS: 150.0000 mg | ORAL_TABLET | Freq: Once | ORAL | 0 refills | Status: AC

## 2016-05-13 NOTE — Telephone Encounter (Signed)
Diflucan called in.

## 2016-05-13 NOTE — Telephone Encounter (Signed)
Patient states she was prescribed amoxicillin-clavulanate (AUGMENTIN) 875-125 MG tablet last week and has developed a yeast infection.  She is requesting something called in to [place].

## 2016-05-13 NOTE — Telephone Encounter (Signed)
Patient notified and verbalized understanding. 

## 2016-05-15 NOTE — Progress Notes (Signed)
GYNECOLOGY  VISIT   HPI: 26 y.o.   Single  Caucasian  female   [id number] with No LMP recorded. Patient is not currently having periods (Reason: IUD).   here for  Vaginal irriation/abnormal discharge/vaginal odor since last Wednesday per patinet/ std testing. Will do full annual exam today.  Took Augmentin for sinusitis. Noted vaginal odor and itchiness.  Received Diflucan which helped some symptoms but still inflamed, itching, and having odor.   Wants full STD testing today.   Working at [employer].  Mirena IUD placed in July, 10 2018. Has amenorrhea.  Unable to feel the strings.  Has had an informal ultrasound at [facility] and they saw the IUD.   Was unsatisfied with Nexplanon so had it removed at the time.  Had full routine labs in September 2018 and these were normal.   Takes Women's vitamin daily.  Taking PNV also.   GYNECOLOGIC HISTORY: No LMP recorded. Patient is not currently having periods (Reason: IUD). Contraception:  Mirena IUD & condoms Menopausal hormone therapy:  n/a Last mammogram:  n/a Last pap smear: 04-18-14 Ascus:Neg HR HPV. Gardasil:  Completed all 3.  Flu vaccine:  Did this year. TDap:  02/12/12  Working out regularly with brother.  Smoker:  3 cig per day.  Trying to quit.         OB History    Gravida Para Term Preterm AB Living   0 0 0 0 0 0   SAB TAB Ectopic Multiple Live Births   0 0 0 0           Patient Active Problem List   Diagnosis Date Noted  . IBS (irritable bowel syndrome) 08/31/2015  . Generalized anxiety disorder 11/15/2014  . Health care maintenance 11/15/2014  . 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 Visit for preventive health examination 02/01/2012    Past Medical History:  Diagnosis Date  . Blood transfusion without reported diagnosis    at birth  . Constipation   . CONSTIPATION, CHRONIC 06/04/2007   Qualifier: Diagnosis of  By: Sherren Mocha MD, Jory Ee   . Dysmenorrhea   . Headache(784.0) 02/26/2010   Qualifier: Diagnosis of   By: Regis Bill MD, Standley Brooking   . Irregular menses    Dr Helane Rima    Past Surgical History:  Procedure Laterality Date  . WISDOM TOOTH EXTRACTION      Current Outpatient Prescriptions  Medication Sig Dispense Refill  . levonorgestrel (MIRENA) 20 MCG/24HR IUD 1 each by Intrauterine route once.     No current facility-administered medications for this visit.      ALLERGIES: Patient has no known allergies.  Family History  Problem Relation Age of Onset  . Hypertension Father   . Diabetes Maternal Aunt   . Thyroid disease Paternal Aunt   . Diabetes Maternal Grandmother   . Heart disease Maternal Grandmother   . Leukemia Maternal Grandfather   . Breast cancer Paternal Grandmother   . Lymphoma Paternal Grandmother     Social History   Social History  . Marital status: Single    Spouse name: N/A  . Number of children: N/A  . Years of education: N/A   Occupational History  . Not on file.   Social History Main Topics  . Smoking status: Never Smoker  . Smokeless tobacco: Never Used  . Alcohol use No  . Drug use: No  . Sexual activity: Yes    Partners: Male    Birth control/ protection: Condom, Implant     Comment:  Nexplanon inserted 08-31-14 left arm   Other Topics Concern  . Not on file   Social History Narrative   hh of 4  With family   No pets   Went to [employer]  Going to [place] smokes.   But neg ets     ROS:  Pertinent items are noted in HPI.  PHYSICAL EXAMINATION:    BP 116/60 (BP Location: Right Arm, Patient Position: Sitting, Cuff Size: Normal)   Pulse 64   Resp 14   Ht 5\' 2"  (1.575 m)   Wt 154 lb (69.9 kg)   BMI 28.17 kg/m     General appearance: alert, cooperative and appears stated age   Pelvic: External genitalia: erythema of the vulva.   Slits in the perineal skin.               Urethra:  normal appearing urethra with no masses, tenderness or lesions              Bartholins and Skenes: normal                 Vagina: normal  appearing vagina with normal color and discharge, no lesions              Cervix: no lesions.  IUD strings present.                 Bimanual Exam:  Uterus:  normal size, contour, position, consistency, mobility, non-tender              Adnexa: no mass, fullness, tenderness                Chaperone was present for exam.  ASSESSMENT  Well woman exam.  Desire for STD testing.  Mirena IUD patient.  Smoker.  Vulvovaginitis.  Looks like Candida.   PLAN  Diflucan and Mycolog II.  STD testing including wet prep. Encouraged self breast awareness.  Pap next year. Dicussed not overtaking of vitamins.  Smoking cessation handout for [facility] program. Follow up yearly and prn.   An After Visit Summary was printed and given to the patient.

## 2016-05-16 ENCOUNTER — Ambulatory Visit: Payer: BLUE CROSS/BLUE SHIELD | Admitting: Obstetrics and Gynecology

## 2016-05-16 ENCOUNTER — Encounter: Payer: Self-pay | Admitting: Obstetrics and Gynecology

## 2016-05-16 VITALS — BP 116/60 | HR 64 | Resp 14 | Ht 62.0 in | Wt 154.0 lb

## 2016-05-16 DIAGNOSIS — N760 Acute vaginitis: Secondary | ICD-10-CM | POA: Diagnosis not present

## 2016-05-16 DIAGNOSIS — Z01419 Encounter for gynecological examination (general) (routine) without abnormal findings: Secondary | ICD-10-CM

## 2016-05-16 DIAGNOSIS — Z113 Encounter for screening for infections with a predominantly sexual mode of transmission: Secondary | ICD-10-CM | POA: Diagnosis not present

## 2016-05-16 MED ORDER — NYSTATIN-TRIAMCINOLONE 100000-0.1 UNIT/GM-% EX CREA: 1.0000 "application " | TOPICAL_CREAM | Freq: Two times a day (BID) | CUTANEOUS | 0 refills | Status: DC

## 2016-05-16 MED ORDER — FLUCONAZOLE 150 MG PO TABS: 150.0000 mg | ORAL_TABLET | Freq: Once | ORAL | 0 refills | Status: AC

## 2016-05-16 NOTE — Patient Instructions (Signed)

## 2016-05-17 ENCOUNTER — Telehealth: Payer: Self-pay

## 2016-05-17 LAB — GC/CHLAMYDIA PROBE AMP
CT PROBE, AMP APTIMA: NOT DETECTED
GC Probe RNA: NOT DETECTED

## 2016-05-17 LAB — WET PREP BY MOLECULAR PROBE
CANDIDA SPECIES: NEGATIVE
GARDNERELLA VAGINALIS: POSITIVE — AB
Trichomonas vaginosis: NEGATIVE

## 2016-05-17 LAB — STD PANEL
HIV: NONREACTIVE
Hepatitis B Surface Ag: NEGATIVE

## 2016-05-17 LAB — HEPATITIS C ANTIBODY: HCV Ab: NEGATIVE

## 2016-05-17 MED ORDER — METRONIDAZOLE 0.75 % VA GEL: 1.0000 | Freq: Every day | VAGINAL | 0 refills | Status: DC

## 2016-05-17 NOTE — Telephone Encounter (Signed)
Left message to call Kaitlyn at [phone]. 

## 2016-05-17 NOTE — Telephone Encounter (Signed)
-----   Message from Nunzio Cobbs, MD sent at 05/17/2016  3:20 PM EST ----- Please inform of results showing bacterial vaginosis.  She may treat with Flagyl 500 mg po bid x 7 days or Metrogel pv at hs for 5 nights.  ETOH precautions. There rest of her STD testing was negative.

## 2016-05-17 NOTE — Telephone Encounter (Signed)
Patient is returning a call to Kaitlyn. °

## 2016-05-17 NOTE — Telephone Encounter (Signed)
Spoke with patient. Advised of results as seen below from [place]. Patient is agreeable and verbalizes understanding. Rx for Metrogel place 1 applicator at bedtime for 5 nights sent to pharmacy on file. Patient is agreeable and verbalizes understanding.  Routing to provider for final review. Patient agreeable to disposition. Will close encounter.

## 2016-05-22 ENCOUNTER — Ambulatory Visit: Payer: BLUE CROSS/BLUE SHIELD | Admitting: Obstetrics and Gynecology

## 2016-06-05 ENCOUNTER — Encounter: Payer: Self-pay | Admitting: Family Medicine

## 2016-06-05 ENCOUNTER — Ambulatory Visit (INDEPENDENT_AMBULATORY_CARE_PROVIDER_SITE_OTHER): Payer: BLUE CROSS/BLUE SHIELD | Admitting: Family Medicine

## 2016-06-05 VITALS — BP 114/73 | HR 70 | Temp 98.3°F | Resp 16 | Wt 156.0 lb

## 2016-06-05 DIAGNOSIS — J0100 Acute maxillary sinusitis, unspecified: Secondary | ICD-10-CM | POA: Diagnosis not present

## 2016-06-05 MED ORDER — DOXYCYCLINE HYCLATE 100 MG PO TABS: 100.0000 mg | ORAL_TABLET | Freq: Two times a day (BID) | ORAL | 0 refills | Status: DC

## 2016-06-05 NOTE — Progress Notes (Signed)
Pre visit review using our clinic review tool, if applicable. No additional management support is needed unless otherwise documented below in the visit note. 

## 2016-06-05 NOTE — Patient Instructions (Addendum)
Rest, hydrate. Humidifier at night.  flonase, nettie pot or nasal saline.  doxycyline prescribed, take until completed.  Anti-histamine daily.     Sinusitis, Adult Sinusitis is soreness and inflammation of your sinuses. Sinuses are hollow spaces in the bones around your face. They are located:  Around your eyes.  In the middle of your forehead.  Behind your nose.  In your cheekbones. Your sinuses and nasal passages are lined with a stringy fluid (mucus). Mucus normally drains out of your sinuses. When your nasal tissues get inflamed or swollen, the mucus can get trapped or blocked so air cannot flow through your sinuses. This lets bacteria, viruses, and funguses grow, and that leads to infection. Follow these instructions at home: Medicines   Take, use, or apply over-the-counter and prescription medicines only as told by your doctor. These may include nasal sprays.  If you were prescribed an antibiotic medicine, take it as told by your doctor. Do not stop taking the antibiotic even if you start to feel better. Hydrate and Humidify   Drink enough water to keep your pee (urine) clear or pale yellow.  Use a cool mist humidifier to keep the humidity level in your home above 50%.  Breathe in steam for 10-15 minutes, 3-4 times a day or as told by your doctor. You can do this in the bathroom while a hot shower is running.  Try not to spend time in cool or dry air. Rest   Rest as much as possible.  Sleep with your head raised (elevated).  Make sure to get enough sleep each night. General instructions   Put a warm, moist washcloth on your face 3-4 times a day or as told by your doctor. This will help with discomfort.  Wash your hands often with soap and water. If there is no soap and water, use hand sanitizer.  Do not smoke. Avoid being around people who are smoking (secondhand smoke).  Keep all follow-up visits as told by your doctor. This is important. Contact a doctor  if:  You have a fever.  Your symptoms get worse.  Your symptoms do not get better within 10 days. Get help right away if:  You have a very bad headache.  You cannot stop throwing up (vomiting).  You have pain or swelling around your face or eyes.  You have trouble seeing.  You feel confused.  Your neck is stiff.  You have trouble breathing. This information is not intended to replace advice given to you by your health care provider. Make sure you discuss any questions you have with your health care provider. Document Released: 09/11/2007 Document Revised: 11/19/2015 Document Reviewed: 01/18/2015 Elsevier Interactive Patient Education  2017 [employer].

## 2016-06-05 NOTE — Progress Notes (Signed)
Angela Jordan , 05-22-1990, 26 y.o., female MRN: [id number] Patient Care Team    Relationship Specialty Notifications Start End  Ma Hillock, DO PCP - General Family Medicine  11/15/14   Dian Queen, MD Attending Physician Obstetrics and Gynecology  02/01/12     CC: sinus pressure Subjective: Pt presents for an acute OV with complaints of sinus pressure of 1 week duration.  Associated symptoms include Sinus pain, eye and ear pressure, headache, nasal congestion. She denies fever, chills, nausea or vomit. She was treated a little over a month ago with full resolution.    No Known Allergies Social History  Substance Use Topics  . Smoking status: Never Smoker  . Smokeless tobacco: Never Used  . Alcohol use No   Past Medical History:  Diagnosis Date  . Blood transfusion without reported diagnosis    at birth  . Constipation   . CONSTIPATION, CHRONIC 06/04/2007   Qualifier: Diagnosis of  By: Sherren Mocha MD, Jory Ee   . Dysmenorrhea   . Headache(784.0) 02/26/2010   Qualifier: Diagnosis of  By: Regis Bill MD, Standley Brooking   . Irregular menses    Dr Helane Rima   Past Surgical History:  Procedure Laterality Date  . WISDOM TOOTH EXTRACTION     Family History  Problem Relation Age of Onset  . Hypertension Father   . Diabetes Maternal Aunt   . Thyroid disease Paternal Aunt   . Diabetes Maternal Grandmother   . Heart disease Maternal Grandmother   . Leukemia Maternal Grandfather   . Breast cancer Paternal Grandmother   . Lymphoma Paternal Grandmother    Allergies as of 06/05/2016   No Known Allergies     Medication List       Accurate as of 06/05/16  3:02 PM. Always use your most recent med list.          levonorgestrel 20 MCG/24HR IUD Commonly known as:  MIRENA 1 each by Intrauterine route once.   metroNIDAZOLE 0.75 % vaginal gel Commonly known as:  METROGEL Place 1 Applicatorful vaginally at bedtime. X 5 nights   nystatin-triamcinolone cream Commonly known as:   MYCOLOG II Apply 1 application topically 2 (two) times daily. Apply to affected area BID for up to 7 days.       No results found for this or any previous visit (from the past 24 hour(s)). No results found.   ROS: Negative, with the exception of above mentioned in HPI   Objective:  BP 114/73 (BP Location: Left Arm, Patient Position: Sitting, Cuff Size: Normal)   Pulse 70   Temp 98.3 F (36.8 C) (Oral)   Resp 16   Wt 156 lb (70.8 kg)   SpO2 98%   BMI 28.53 kg/m  Body mass index is 28.53 kg/m. Gen: Afebrile. No acute distress. Nontoxic in appearance, well developed, well nourished.  HENT: AT. [place]. Bilateral TM visualized with bilateral ear fullness. MMM, no oral lesions. Bilateral nares with erythema, drainage and swelling. Throat without erythema or exudates. No cough, no hoarseness. TTP max sinus.  Eyes:Pupils Equal Round Reactive to light, Extraocular movements intact,  Conjunctiva without redness, discharge or icterus. Neck/lymp/endocrine: Supple,no lymphadenopathy CV: RRR * Chest: CTAB, no wheeze or crackles. Good air movement, normal resp effort.  Abd: Soft. NTND. BS present.  Skin: no rashes, purpura or petechiae.  Neuro:  Normal gait. PERLA. EOMi. Alert. Oriented x3   Assessment/Plan: Angela Jordan is a 26 y.o. female present for acute OV for  Acute  maxillary sinusitis, recurrence not specified - rest, hydrate. - start daily flonase and antihistamine.  - nasal saline, humidifer - doxycycline (VIBRA-TABS) 100 MG tablet; Take 1 tablet (100 mg total) by mouth 2 (two) times daily.  Dispense: 20 tablet; Refill: 0 - F/U PRN   electronically signed by:  Howard Pouch, DO  [employer]

## 2016-07-20 DIAGNOSIS — R05 Cough: Secondary | ICD-10-CM | POA: Diagnosis not present

## 2016-07-20 DIAGNOSIS — J0140 Acute pansinusitis, unspecified: Secondary | ICD-10-CM | POA: Diagnosis not present

## 2016-07-20 DIAGNOSIS — J301 Allergic rhinitis due to pollen: Secondary | ICD-10-CM | POA: Diagnosis not present

## 2016-07-23 ENCOUNTER — Ambulatory Visit (INDEPENDENT_AMBULATORY_CARE_PROVIDER_SITE_OTHER): Payer: BLUE CROSS/BLUE SHIELD | Admitting: Family Medicine

## 2016-07-23 ENCOUNTER — Encounter: Payer: Self-pay | Admitting: Family Medicine

## 2016-07-23 VITALS — BP 112/66 | HR 67 | Temp 97.9°F | Resp 20 | Wt 164.5 lb

## 2016-07-23 DIAGNOSIS — R1013 Epigastric pain: Secondary | ICD-10-CM | POA: Diagnosis not present

## 2016-07-23 DIAGNOSIS — R112 Nausea with vomiting, unspecified: Secondary | ICD-10-CM

## 2016-07-23 MED ORDER — OMEPRAZOLE 20 MG PO CPDR: 20.0000 mg | DELAYED_RELEASE_CAPSULE | Freq: Every day | ORAL | 0 refills | Status: DC

## 2016-07-23 MED ORDER — ONDANSETRON HCL 4 MG PO TABS: 4.0000 mg | ORAL_TABLET | Freq: Three times a day (TID) | ORAL | 0 refills | Status: DC | PRN

## 2016-07-23 MED ORDER — ALIGN 4 MG PO CAPS: 1.0000 | ORAL_CAPSULE | Freq: Every day | ORAL | 1 refills | Status: DC

## 2016-07-23 NOTE — Patient Instructions (Signed)
Align daily for 8 weeks.  Yogurt is good.  Prilosec (omeprazole) daily for 2 weeks, then can try off.  Zofran as needed for nausea.   I think this is from taking a few rounds of antibiotics over the last few months.

## 2016-07-23 NOTE — Progress Notes (Signed)
Angela Jordan , Apr 30, 1990, 26 y.o., female MRN: [id number] Patient Care Team    Relationship Specialty Notifications Start End  Ma Hillock, DO PCP - General Family Medicine  11/15/14   Dian Queen, MD Attending Physician Obstetrics and Gynecology  02/01/12     CC: nausea Subjective: Pt presents for an  OV with complaints of nausea of 2 days duration.  Associated symptoms include recent treatment for sinus infection with doxycyline and tessalon perles. She states she is feeling better from the sinus infection stand point. She has 4 more days of the antibiotic left. She has been on doxycyline in the past without nausea/vomit. She has also been prescribed augmentin for sinus infections in the past. She reports she is tolerating food on her stomach, but did vomit today. She was able to eat toast after with nausea remaining. She denies diarrhea, but endorses mild stomach cramping.  Depression screen PHQ 2/9 09/27/2014  Decreased Interest 0  Down, Depressed, Hopeless 0  PHQ - 2 Score 0    No Known Allergies Social History  Substance Use Topics  . Smoking status: Never Smoker  . Smokeless tobacco: Never Used  . Alcohol use No   Past Medical History:  Diagnosis Date  . Blood transfusion without reported diagnosis    at birth  . Constipation   . CONSTIPATION, CHRONIC 06/04/2007   Qualifier: Diagnosis of  By: Sherren Mocha MD, Jory Ee   . Dysmenorrhea   . Headache(784.0) 02/26/2010   Qualifier: Diagnosis of  By: Regis Bill MD, Standley Brooking   . Irregular menses    Dr Helane Rima   Past Surgical History:  Procedure Laterality Date  . WISDOM TOOTH EXTRACTION     Family History  Problem Relation Age of Onset  . Hypertension Father   . Diabetes Maternal Aunt   . Thyroid disease Paternal Aunt   . Diabetes Maternal Grandmother   . Heart disease Maternal Grandmother   . Leukemia Maternal Grandfather   . Breast cancer Paternal Grandmother   . Lymphoma Paternal Grandmother    Allergies as of  07/23/2016   No Known Allergies     Medication List       Accurate as of 07/23/16  1:30 PM. Always use your most recent med list.          benzonatate 100 MG capsule Commonly known as:  TESSALON Swallow whole one (100mg ) capsule by mouth 3 times a day  as needed.Do not break, chew, dissolve, cut or crush.   doxycycline 100 MG tablet Commonly known as:  VIBRA-TABS Take 1 tablet (100 mg total) by mouth 2 (two) times daily.   fluticasone 50 MCG/ACT nasal spray Commonly known as:  FLONASE Place into the nose.   levonorgestrel 20 MCG/24HR IUD Commonly known as:  MIRENA 1 each by Intrauterine route once.   loratadine 10 MG tablet Commonly known as:  CLARITIN Take by mouth.   metroNIDAZOLE 0.75 % vaginal gel Commonly known as:  METROGEL Place 1 Applicatorful vaginally at bedtime. X 5 nights   nystatin-triamcinolone cream Commonly known as:  MYCOLOG II Apply 1 application topically 2 (two) times daily. Apply to affected area BID for up to 7 days.       No results found for this or any previous visit (from the past 24 hour(s)). No results found.   ROS: Negative, with the exception of above mentioned in HPI   Objective:  BP 112/66 (BP Location: Right Arm, Patient Position: Sitting, Cuff Size: Normal)  Pulse 67   Temp 97.9 F (36.6 C)   Resp 20   Wt 164 lb 8 oz (74.6 kg)   SpO2 98%   BMI 30.09 kg/m  Body mass index is 30.09 kg/m. Gen: Afebrile. No acute distress. Nontoxic in appearance, well developed, well nourished. Pleasant caucasian female.  HENT: AT. [place]. MMM, no oral lesions.  Eyes:Pupils Equal Round Reactive to light, Extraocular movements intact,  Conjunctiva without redness, discharge or icterus. CV: RRR  Chest: CTAB, no wheeze or crackles. Good air movement, normal resp effort.  Abd: Soft. flat. NTND. BS present. no Masses palpated. No rebound or guarding.  Neuro:  Normal gait. PERLA. EOMi. Alert. Oriented x3   Assessment/Plan: Angela Jordan is  a 26 y.o. female present for OV for  Epigastric pain Nausea and vomiting, intractability of vomiting not specified, unspecified vomiting type - possibly mild gastritis vs antibiotic reaction. She has been prescribed an antibiotic 3 times in 4.5 months for infections. Currently no diarrhea to suggest C.Diff. Discussed PPI, zofran and probiotic with patient today. She is agreeable to plan.  - ondansetron (ZOFRAN) 4 MG tablet; Take 1 tablet (4 mg total) by mouth every 8 (eight) hours as needed for nausea or vomiting.  Dispense: 20 tablet; Refill: 0 - omeprazole (PRILOSEC) 20 MG capsule; Take 1 capsule (20 mg total) by mouth daily.  Dispense: 30 capsule; Refill: 0 - Probiotic Product (ALIGN) 4 MG CAPS; Take 1 capsule (4 mg total) by mouth daily.  Dispense: 30 capsule; Refill: 1 - if unable to tolerate remaining of abx regimen, could consider switching to Augmentin.  - f/u as needed  Reviewed expectations re: course of current medical issues.  Discussed self-management of symptoms.  Outlined signs and symptoms indicating need for more acute intervention.  Patient verbalized understanding and all questions were answered.  Patient received an After-Visit Summary.   electronically signed by:  Howard Pouch, DO  [place]

## 2016-08-13 ENCOUNTER — Encounter: Payer: Self-pay | Admitting: Family Medicine

## 2016-08-13 ENCOUNTER — Ambulatory Visit (INDEPENDENT_AMBULATORY_CARE_PROVIDER_SITE_OTHER): Payer: BLUE CROSS/BLUE SHIELD | Admitting: Family Medicine

## 2016-08-13 VITALS — BP 103/71 | HR 83 | Temp 99.9°F | Resp 20 | Wt 156.5 lb

## 2016-08-13 DIAGNOSIS — J039 Acute tonsillitis, unspecified: Secondary | ICD-10-CM

## 2016-08-13 DIAGNOSIS — J029 Acute pharyngitis, unspecified: Secondary | ICD-10-CM

## 2016-08-13 LAB — POCT RAPID STREP A (OFFICE): Rapid Strep A Screen: NEGATIVE

## 2016-08-13 MED ORDER — METHYLPREDNISOLONE ACETATE 40 MG/ML IJ SUSP
80.0000 mg | Freq: Once | INTRAMUSCULAR | Status: AC
  Administered 2016-08-13: 80 mg via INTRAMUSCULAR

## 2016-08-13 MED ORDER — PREDNISONE 20 MG PO TABS: ORAL_TABLET | ORAL | 0 refills | Status: DC

## 2016-08-13 MED ORDER — AMOXICILLIN-POT CLAVULANATE 875-125 MG PO TABS: 1.0000 | ORAL_TABLET | Freq: Two times a day (BID) | ORAL | 0 refills | Status: DC

## 2016-08-13 MED ORDER — METHYLPREDNISOLONE ACETATE 80 MG/ML IJ SUSP: 80.0000 mg | Freq: Once | INTRAMUSCULAR | Status: DC

## 2016-08-13 NOTE — Progress Notes (Signed)
Angela Jordan , 13-May-1990, 26 y.o., female MRN: [id number] Patient Care Team    Relationship Specialty Notifications Start End  Ma Hillock, DO PCP - General Family Medicine  11/15/14   Dian Queen, MD Attending Physician Obstetrics and Gynecology  02/01/12     Chief Complaint  Patient presents with  . Sore Throat  . Ear Pain    both     Subjective:  Pt presents with a 4 day complaint of worsening sore throat, bilateral ear pain, fever, chills and nausea. Her throat is significantly swollen and sore, making it difficult to swallow anything or talk. She has been on 5 abx treatments since January, always with resolution of those symptoms. She has had frequent sinus infections and cases of pharyngitis over the last year with significant pain and lymphadenopathy. She is taking a daily antihistamine. She denies rash.   Depression screen PHQ 2/9 09/27/2014  Decreased Interest 0  Down, Depressed, Hopeless 0  PHQ - 2 Score 0    No Known Allergies Social History  Substance Use Topics  . Smoking status: Never Smoker  . Smokeless tobacco: Never Used  . Alcohol use No   Past Medical History:  Diagnosis Date  . Blood transfusion without reported diagnosis    at birth  . Constipation   . CONSTIPATION, CHRONIC 06/04/2007   Qualifier: Diagnosis of  By: Sherren Mocha MD, Jory Ee   . Dysmenorrhea   . Headache(784.0) 02/26/2010   Qualifier: Diagnosis of  By: Regis Bill MD, Standley Brooking   . Irregular menses    Dr Helane Rima   Past Surgical History:  Procedure Laterality Date  . WISDOM TOOTH EXTRACTION     Family History  Problem Relation Age of Onset  . Hypertension Father   . Diabetes Maternal Aunt   . Thyroid disease Paternal Aunt   . Diabetes Maternal Grandmother   . Heart disease Maternal Grandmother   . Leukemia Maternal Grandfather   . Breast cancer Paternal Grandmother   . Lymphoma Paternal Grandmother    Allergies as of 08/13/2016   No Known Allergies     Medication List         Accurate as of 08/13/16 12:49 PM. Always use your most recent med list.          ALIGN 4 MG Caps Take 1 capsule (4 mg total) by mouth daily.   amoxicillin-clavulanate 875-125 MG tablet Commonly known as:  AUGMENTIN Take 1 tablet by mouth 2 (two) times daily.   fluticasone 50 MCG/ACT nasal spray Commonly known as:  FLONASE Place into the nose.   levonorgestrel 20 MCG/24HR IUD Commonly known as:  MIRENA 1 each by Intrauterine route once.   loratadine 10 MG tablet Commonly known as:  CLARITIN Take by mouth.   omeprazole 20 MG capsule Commonly known as:  PRILOSEC Take 1 capsule (20 mg total) by mouth daily.   ondansetron 4 MG tablet Commonly known as:  ZOFRAN Take 1 tablet (4 mg total) by mouth every 8 (eight) hours as needed for nausea or vomiting.   predniSONE 20 MG tablet Commonly known as:  DELTASONE 60 mg x3d, 40 mg x3d, 20 mg x2d, 10 mg x2d       All past medical history, surgical history, allergies, family history, immunizations andmedications were updated in the EMR today and reviewed under the history and medication portions of their EMR.     ROS: Negative, with the exception of above mentioned in HPI   Objective:  BP  103/71 (BP Location: Left Arm, Patient Position: Sitting, Cuff Size: Large)   Pulse 83   Temp 99.9 F (37.7 C)   Resp 20   Wt 156 lb 8 oz (71 kg)   SpO2 95%   BMI 28.62 kg/m  Body mass index is 28.62 kg/m. Gen: febrile. Appears in pain. Nontoxic in appearance, well developed, well nourished. Very pleasant caucasian female.   HENT: AT. [place]. Bilateral TM visualized with mild fullness bilateral nad air lfuid levels. No erythema or bulging. EAM normal.  MMM, no oral lesions. Bilateral nares without erythema or drainage. Throat with erythema and very mild exudates L>R tonsil. Enlarged erythremic tonsils. Hoarseness, difficult speaking 2/2 to pain. No drooling.  Eyes:Pupils Equal Round Reactive to light, Extraocular movements intact,   Conjunctiva without redness, discharge or icterus. Neck/lymp/endocrine: Supple,moderate bilateral ant cervical lymphadenopathy with tenderness.  CV: RRR  Chest: CTAB, no wheeze or crackles. Good air movement, normal resp effort.  Abd: Soft. NTND. BS present Skin: no rashes, purpura or petechiae.  Neuro:  Normal gait. PERLA. EOMi. Alert. Oriented x3   No exam data present No results found. Results for orders placed or performed in visit on 08/13/16 (from the past 24 hour(s))  POCT rapid strep A     Status: Normal   Collection Time: 08/13/16 11:16 AM  Result Value Ref Range   Rapid Strep A Screen Negative Negative    Assessment/Plan: Angela Jordan is a 26 y.o. female present for OV for  Pharyngitis, unspecified etiology/tonsillitis  - rapid strep negative - Upper Respiratory Culture - predniSONE (DELTASONE) 20 MG tablet; 60 mg x3d, 40 mg x3d, 20 mg x2d, 10 mg x2d  Dispense: 18 tablet; Refill: 0 - amoxicillin-clavulanate (AUGMENTIN) 875-125 MG tablet; Take 1 tablet by mouth 2 (two) times daily.  Dispense: 20 tablet; Refill: 0 - Ambulatory referral to ENT--> had referred a little over a year ago, at that time she did not want surgery--> now she is ready for surgery if indicated.  Multiple repeat infections of sinus and tonsils.  - methylPREDNISolone acetate (DEPO-MEDROL) injection 80 mg; Inject 2 mLs (80 mg total) into the muscle once. - rest, hydrate, advil/tylenol, chloraseptic/salt water gargle - IM depo medrol, prednisone taper. Augmentin.  - F/u PRN  Reviewed expectations re: course of current medical issues.  Discussed self-management of symptoms.  Outlined signs and symptoms indicating need for more acute intervention.  Patient verbalized understanding and all questions were answered.  Patient received an After-Visit Summary.     Note is dictated utilizing voice recognition software. Although note has been proof read prior to signing, occasional typographical errors  still can be missed. If any questions arise, please do not hesitate to call for verification.   electronically signed by:  Howard Pouch, DO  [place]

## 2016-08-13 NOTE — Patient Instructions (Signed)
Rest, hydrate, advil Gargle salt water, use chloraseptic Augmentin start today Prednisone start tomorrow.   ENT referral placed.

## 2016-08-15 ENCOUNTER — Telehealth: Payer: Self-pay | Admitting: Family Medicine

## 2016-08-15 LAB — CULTURE, UPPER RESPIRATORY

## 2016-08-15 MED ORDER — CEFDINIR 300 MG PO CAPS: 600.0000 mg | ORAL_CAPSULE | Freq: Every day | ORAL | 0 refills | Status: DC

## 2016-08-15 NOTE — Addendum Note (Signed)
Addended by: Ralph Dowdy on: 08/15/2016 04:24 PM   Modules accepted: Orders

## 2016-08-15 NOTE — Telephone Encounter (Signed)
Left message with instructions on patient voice mail. 

## 2016-08-15 NOTE — Telephone Encounter (Signed)
Please call patient: Her throat culture is positive for strep throat. Continue antibiotic to completion, only follow-up if needed after. Referral was placed for ENT.

## 2016-08-15 NOTE — Telephone Encounter (Signed)
Stay out of work tomorrow, may return 08/17/16 if not having any fever. Stop augmentin.  Pls eRx cefdinir 300mg  caps, 1 cap po bid x 10d, #20, no RF.-thx!

## 2016-08-15 NOTE — Telephone Encounter (Signed)
Patient advised of new medication instructions and to stay out of work.  Printed work note for patient.

## 2016-08-15 NOTE — Telephone Encounter (Addendum)
Patient advised that she is positive for strep throat but patient states she wasn't able to tolerate augmentin that was sent in.  She states she vomited directly after taking medication.  Is there an alternative?   Patient also concerned about being contagious and what she should do as far as work?   Sending message to Dr Anitra Lauth since Dr Raoul Pitch is out of the office right now.

## 2016-09-10 ENCOUNTER — Telehealth: Payer: Self-pay | Admitting: Family Medicine

## 2016-09-10 NOTE — Telephone Encounter (Signed)
Spoke with Dr Raoul Pitch her instructions to patient were since this has been Chronic we wanted her to see ENT. She has scheduled appt 09/11/16 with Dr Janace Hoard and should be evaluated by him for recommendations for further treatment. Left message for patient to return call to review information.

## 2016-09-10 NOTE — Telephone Encounter (Signed)
Please call patient back. She is requesting another antibiotic. She is still having the same issues.

## 2016-09-11 NOTE — Telephone Encounter (Signed)
Patient has not returned call but was scheduled to see ENT today.

## 2016-09-12 DIAGNOSIS — J3501 Chronic tonsillitis: Secondary | ICD-10-CM | POA: Diagnosis not present

## 2016-10-22 DIAGNOSIS — J351 Hypertrophy of tonsils: Secondary | ICD-10-CM | POA: Diagnosis not present

## 2016-10-22 DIAGNOSIS — J3501 Chronic tonsillitis: Secondary | ICD-10-CM | POA: Diagnosis not present

## 2016-10-23 HISTORY — PX: TONSILLECTOMY: SUR1361

## 2016-12-21 DIAGNOSIS — J209 Acute bronchitis, unspecified: Secondary | ICD-10-CM | POA: Diagnosis not present

## 2017-01-24 DIAGNOSIS — Z114 Encounter for screening for human immunodeficiency virus [HIV]: Secondary | ICD-10-CM | POA: Diagnosis not present

## 2017-01-24 DIAGNOSIS — Z113 Encounter for screening for infections with a predominantly sexual mode of transmission: Secondary | ICD-10-CM | POA: Diagnosis not present

## 2017-03-24 ENCOUNTER — Telehealth: Payer: Self-pay | Admitting: *Deleted

## 2017-03-24 NOTE — Telephone Encounter (Signed)
Left message on patient voice mail she will need an appt for evaluation prior to getting a referral to GI.

## 2017-04-07 ENCOUNTER — Ambulatory Visit: Payer: BLUE CROSS/BLUE SHIELD | Admitting: Family Medicine

## 2017-04-07 ENCOUNTER — Encounter: Payer: Self-pay | Admitting: Family Medicine

## 2017-04-07 VITALS — BP 116/77 | HR 67 | Temp 98.4°F | Resp 20 | Wt 161.2 lb

## 2017-04-07 DIAGNOSIS — K580 Irritable bowel syndrome with diarrhea: Secondary | ICD-10-CM

## 2017-04-07 DIAGNOSIS — R109 Unspecified abdominal pain: Secondary | ICD-10-CM

## 2017-04-07 MED ORDER — ONDANSETRON HCL 4 MG PO TABS: 4.0000 mg | ORAL_TABLET | Freq: Three times a day (TID) | ORAL | 0 refills | Status: DC | PRN

## 2017-04-07 NOTE — Progress Notes (Signed)
Angela Jordan , 07/21/1990, 26 y.o., female MRN: [id number] Patient Care Team    Relationship Specialty Notifications Start End  Ma Hillock, DO PCP - General Family Medicine  11/15/14   Dian Queen, MD Attending Physician Obstetrics and Gynecology  02/01/12     Chief Complaint  Patient presents with  . Abdominal Pain    nausea wants referral to GI     Subjective: Pt presents for an OV with complaints of nausea and abdominal cramping that has been intermittent over the last few years. Patient was seen in April 2018 and May 2017 for similar symptoms. She feels that her symptoms has been intermittent probably for the last 5 years. She has been tried on Prilosec, Bentyl and probiotics. None of which she has felt she has had symptom relief from using. She does not feel it matters what type of food she eats she will intermittently get cramping and diarrhea directly after eating. She feels that her symptoms of nausea and cramping or worsening over the last month. She feels she is nauseous when she goes to bed and when she wakes up. She has taking multiple pregnancy tests which were negative. May 2017 at the time of symptoms patient had a normal CMP, CRP, CBC, sedimentation rate, celiac panel and H. pylori IgG. She has endorsed her brother has had similar symptoms, no other family members affected by IBS or IBD.  Depression screen [facility] 2/9 04/07/2017 09/27/2014  Decreased Interest 0 0  Down, Depressed, Hopeless 0 0  PHQ - 2 Score 0 0    No Known Allergies Social History   Tobacco Use  . Smoking status: Never Smoker  . Smokeless tobacco: Never Used  Substance Use Topics  . Alcohol use: No    Alcohol/week: 0.0 oz   Past Medical History:  Diagnosis Date  . Blood transfusion without reported diagnosis    at birth  . Constipation   . CONSTIPATION, CHRONIC 06/04/2007   Qualifier: Diagnosis of  By: Sherren Mocha MD, Jory Ee   . Dysmenorrhea   . Headache(784.0) 02/26/2010   Qualifier:  Diagnosis of  By: Regis Bill MD, Standley Brooking   . Irregular menses    Dr Helane Rima   Past Surgical History:  Procedure Laterality Date  . TONSILLECTOMY  10/23/2016  . WISDOM TOOTH EXTRACTION     Family History  Problem Relation Age of Onset  . Hypertension Father   . Diabetes Maternal Aunt   . Thyroid disease Paternal Aunt   . Diabetes Maternal Grandmother   . Heart disease Maternal Grandmother   . Leukemia Maternal Grandfather   . Breast cancer Paternal Grandmother   . Lymphoma Paternal Grandmother    Allergies as of 04/07/2017   No Known Allergies     Medication List        Accurate as of 04/07/17  8:33 AM. Always use your most recent med list.          ALIGN 4 MG Caps Take 1 capsule (4 mg total) by mouth daily.   levonorgestrel 20 MCG/24HR IUD Commonly known as:  MIRENA 1 each by Intrauterine route once.   omeprazole 20 MG capsule Commonly known as:  PRILOSEC Take 1 capsule (20 mg total) by mouth daily.   ondansetron 4 MG tablet Commonly known as:  ZOFRAN Take 1 tablet (4 mg total) by mouth every 8 (eight) hours as needed for nausea or vomiting.       All past medical history, surgical history, allergies, family  history, immunizations andmedications were updated in the EMR today and reviewed under the history and medication portions of their EMR.     ROS: Negative, with the exception of above mentioned in HPI   Objective:  BP 116/77 (BP Location: Left Arm, Patient Position: Sitting, Cuff Size: Normal)   Pulse 67   Temp 98.4 F (36.9 C)   Resp 20   Wt 161 lb 4 oz (73.1 kg)   SpO2 98%   BMI 29.49 kg/m  Body mass index is 29.49 kg/m. Gen: Afebrile. No acute distress. Nontoxic in appearance, well developed, well nourished. Very pleasant Caucasian female. HENT: AT. [place]. MMM, no oral lesions.  Eyes:Pupils Equal Round Reactive to light, Extraocular movements intact,  Conjunctiva without redness, discharge or icterus. Neck/lymp/endocrine: Supple, no  lymphadenopathy CV: RRR  Chest: CTAB, no wheeze or crackles.   Abd: Soft. Flat. NTND. BS present. No Masses palpated. No rebound or guarding. Negative Murphy's. Skin: no rashes, purpura or petechiae.  Neuro: Normal gait. PERLA. EOMi. Alert. Oriented x3  No exam data present No results found. No results found for this or any previous visit (from the past 24 hour(s)).  Assessment/Plan: Angela Jordan is a 26 y.o. female present for OV for  Nausea/abdominal cramping IBS symptoms - Repeat symptoms since May 2017, with normal laboratory workup so far. She has not responded to Bentyl, probiotics or Prilosec. Discussed further workup, trial of amitriptyline,  or GI referral with patient today and she would like to have a referral to gastroenterology. - Patient is to continue to monitor for any food triggers. Start B-6 supplementation. - Zofran prescribed when necessary. - GI referral placed today. - Follow-up when necessary, patient agreeable with plan.  Reviewed expectations re: course of current medical issues.  Discussed self-management of symptoms.  Outlined signs and symptoms indicating need for more acute intervention.  Patient verbalized understanding and all questions were answered.  Patient received an After-Visit Summary.    No orders of the defined types were placed in this encounter.    Note is dictated utilizing voice recognition software. Although note has been proof read prior to signing, occasional typographical errors still can be missed. If any questions arise, please do not hesitate to call for verification.   electronically signed by:  Howard Pouch, DO  [place]

## 2017-04-07 NOTE — Patient Instructions (Signed)
I have placed a referral to GI for you.  Until then avoid triggers, try B6 and I have called in zofran.   Diet for Irritable Bowel Syndrome When you have irritable bowel syndrome (IBS), the foods you eat and your eating habits are very important. IBS may cause various symptoms, such as abdominal pain, constipation, or diarrhea. Choosing the right foods can help ease discomfort caused by these symptoms. Work with your health care provider and dietitian to find the best eating plan to help control your symptoms. What general guidelines do I need to follow?  Keep a food diary. This will help you identify foods that cause symptoms. Write down: ? What you eat and when. ? What symptoms you have. ? When symptoms occur in relation to your meals.  Avoid foods that cause symptoms. Talk with your dietitian about other ways to get the same nutrients that are in these foods.  Eat more foods that contain fiber. Take a fiber supplement if directed by your dietitian.  Eat your meals slowly, in a relaxed setting.  Aim to eat 5-6 small meals per day. Do not skip meals.  Drink enough fluids to keep your urine clear or pale yellow.  Ask your health care provider if you should take an over-the-counter probiotic during flare-ups to help restore healthy gut bacteria.  If you have cramping or diarrhea, try making your meals low in fat and high in carbohydrates. Examples of carbohydrates are pasta, rice, whole grain breads and cereals, fruits, and vegetables.  If dairy products cause your symptoms to flare up, try eating less of them. You might be able to handle yogurt better than other dairy products because it contains bacteria that help with digestion. What foods are not recommended? The following are some foods and drinks that may worsen your symptoms:  Fatty foods, such as [place] fries.  Milk products, such as cheese or ice cream.  Chocolate.  Alcohol.  Products with caffeine, such as  coffee.  Carbonated drinks, such as soda.  The items listed above may not be a complete list of foods and beverages to avoid. Contact your dietitian for more information. What foods are good sources of fiber? Your health care provider or dietitian may recommend that you eat more foods that contain fiber. Fiber can help reduce constipation and other IBS symptoms. Add foods with fiber to your diet a little at a time so that your body can get used to them. Too much fiber at once might cause gas and swelling of your abdomen. The following are some foods that are good sources of fiber:  Apples.  Peaches.  Pears.  Berries.  Figs.  Broccoli (raw).  Cabbage.  Carrots.  Raw peas.  Kidney beans.  Lima beans.  Whole grain bread.  Whole grain cereal.  Where to find more information: [employer] for Functional Gastrointestinal Disorders: www.iffgd.[employer] of Diabetes and Digestive and Kidney Diseases: NetworkAffair.co.za.aspx This information is not intended to replace advice given to you by your health care provider. Make sure you discuss any questions you have with your health care provider. Document Released: 06/15/2003 Document Revised: 08/31/2015 Document Reviewed: 06/25/2013 Elsevier Interactive Patient Education  2018 [employer].

## 2017-04-10 ENCOUNTER — Encounter: Payer: Self-pay | Admitting: Gastroenterology

## 2017-05-07 ENCOUNTER — Encounter: Payer: Self-pay | Admitting: Family Medicine

## 2017-05-07 ENCOUNTER — Ambulatory Visit: Payer: BLUE CROSS/BLUE SHIELD | Admitting: Family Medicine

## 2017-05-07 VITALS — BP 125/77 | HR 82 | Temp 98.1°F | Resp 20 | Wt 167.5 lb

## 2017-05-07 DIAGNOSIS — J069 Acute upper respiratory infection, unspecified: Secondary | ICD-10-CM | POA: Diagnosis not present

## 2017-05-07 DIAGNOSIS — B9789 Other viral agents as the cause of diseases classified elsewhere: Secondary | ICD-10-CM | POA: Diagnosis not present

## 2017-05-07 MED ORDER — BENZONATATE 100 MG PO CAPS: 100.0000 mg | ORAL_CAPSULE | Freq: Two times a day (BID) | ORAL | 0 refills | Status: DC | PRN

## 2017-05-07 MED ORDER — AMOXICILLIN-POT CLAVULANATE 875-125 MG PO TABS: 1.0000 | ORAL_TABLET | Freq: Two times a day (BID) | ORAL | 0 refills | Status: DC

## 2017-05-07 MED ORDER — FLUCONAZOLE 150 MG PO TABS: 150.0000 mg | ORAL_TABLET | Freq: Once | ORAL | 0 refills | Status: AC

## 2017-05-07 NOTE — Patient Instructions (Signed)
Rest, hydrate.  + flonase, mucinex (DM if cough), nettie pot or nasal saline.  If not improving by Friday or worsening, please start antibiotic printed for you.  Diflucan also prescribed if abx needed.  Tessalon perles for cough.  Augmentin prescribed, take until completed if you start it.  If cough present it can last up to 6-8 weeks.  F/U 2 weeks of not improved.     Upper Respiratory Infection, Adult Most upper respiratory infections (URIs) are caused by a virus. A URI affects the nose, throat, and upper air passages. The most common type of URI is often called "the common cold." Follow these instructions at home:  Take medicines only as told by your doctor.  Gargle warm saltwater or take cough drops to comfort your throat as told by your doctor.  Use a warm mist humidifier or inhale steam from a shower to increase air moisture. This may make it easier to breathe.  Drink enough fluid to keep your pee (urine) clear or pale yellow.  Eat soups and other clear broths.  Have a healthy diet.  Rest as needed.  Go back to work when your fever is gone or your doctor says it is okay. ? You may need to stay home longer to avoid giving your URI to others. ? You can also wear a face mask and wash your hands often to prevent spread of the virus.  Use your inhaler more if you have asthma.  Do not use any tobacco products, including cigarettes, chewing tobacco, or electronic cigarettes. If you need help quitting, ask your doctor. Contact a doctor if:  You are getting worse, not better.  Your symptoms are not helped by medicine.  You have chills.  You are getting more short of breath.  You have brown or red mucus.  You have yellow or brown discharge from your nose.  You have pain in your face, especially when you bend forward.  You have a fever.  You have puffy (swollen) neck glands.  You have pain while swallowing.  You have white areas in the back of your throat. Get  help right away if:  You have very bad or constant: ? Headache. ? Ear pain. ? Pain in your forehead, behind your eyes, and over your cheekbones (sinus pain). ? Chest pain.  You have long-lasting (chronic) lung disease and any of the following: ? Wheezing. ? Long-lasting cough. ? Coughing up blood. ? A change in your usual mucus.  You have a stiff neck.  You have changes in your: ? Vision. ? Hearing. ? Thinking. ? Mood. This information is not intended to replace advice given to you by your health care provider. Make sure you discuss any questions you have with your health care provider. Document Released: 09/11/2007 Document Revised: 11/26/2015 Document Reviewed: 06/30/2013 Elsevier Interactive Patient Education  2018 [employer].

## 2017-05-07 NOTE — Progress Notes (Signed)
Angela Jordan , Sep 14, 1990, 27 y.o., female MRN: [id number] Patient Care Team    Relationship Specialty Notifications Start End  Ma Hillock, DO PCP - General Family Medicine  11/15/14   Dian Queen, MD Attending Physician Obstetrics and Gynecology  02/01/12     Chief Complaint  Patient presents with  . URI    congestion,cough, drainage facial pressure x 3 days     Subjective: Pt presents for an OV with complaints of cough, congestion, drainage, facial pressure, sore throat  of 3 days  duration.  Associated symptoms include chills. She denies fever, nausea or vomit. She is eating and drinking well. She had her flu shot this year.  Pt has tried sudafed, dayquil, mucinex and flonase. to ease their symptoms.   Depression screen [facility] 2/9 05/07/2017 04/07/2017 09/27/2014  Decreased Interest 0 0 0  Down, Depressed, Hopeless - 0 0  PHQ - 2 Score 0 0 0    No Known Allergies Social History   Tobacco Use  . Smoking status: Never Smoker  . Smokeless tobacco: Never Used  Substance Use Topics  . Alcohol use: No    Alcohol/week: 0.0 oz   Past Medical History:  Diagnosis Date  . Blood transfusion without reported diagnosis    at birth  . Constipation   . CONSTIPATION, CHRONIC 06/04/2007   Qualifier: Diagnosis of  By: Sherren Mocha MD, Jory Ee   . Dysmenorrhea   . Headache(784.0) 02/26/2010   Qualifier: Diagnosis of  By: Regis Bill MD, Standley Brooking   . Irregular menses    Angela Jordan   Past Surgical History:  Procedure Laterality Date  . TONSILLECTOMY  10/23/2016  . WISDOM TOOTH EXTRACTION     Family History  Problem Relation Age of Onset  . Hypertension Father   . Diabetes Maternal Aunt   . Thyroid disease Paternal Aunt   . Diabetes Maternal Grandmother   . Heart disease Maternal Grandmother   . Leukemia Maternal Grandfather   . Breast cancer Paternal Grandmother   . Lymphoma Paternal Grandmother    Allergies as of 05/07/2017   No Known Allergies     Medication List       Accurate as of 05/07/17  1:53 PM. Always use your most recent med list.          amoxicillin-clavulanate 875-125 MG tablet Commonly known as:  AUGMENTIN Take 1 tablet by mouth 2 (two) times daily.   benzonatate 100 MG capsule Commonly known as:  TESSALON Take 1 capsule (100 mg total) by mouth 2 (two) times daily as needed for cough.   fluconazole 150 MG tablet Commonly known as:  DIFLUCAN Take 1 tablet (150 mg total) by mouth once for 1 dose.   levonorgestrel 20 MCG/24HR IUD Commonly known as:  MIRENA 1 each by Intrauterine route once.       All past medical history, surgical history, allergies, family history, immunizations andmedications were updated in the EMR today and reviewed under the history and medication portions of their EMR.     ROS: Negative, with the exception of above mentioned in HPI   Objective:  BP 125/77 (BP Location: Left Arm, Patient Position: Sitting, Cuff Size: Large)   Pulse 82   Temp 98.1 F (36.7 C)   Resp 20   Wt 167 lb 8 oz (76 kg)   SpO2 98%   BMI 30.64 kg/m  Body mass index is 30.64 kg/m. Gen: Afebrile. No acute distress. Nontoxic in appearance, well developed, well nourished.  HENT: AT. [place]. Bilateral TM visualized with bilateral fullness. MMM, no oral lesions. Bilateral nares with drainage. Throat without erythema or exudates. PND present, mild cough and hoarseness.  Eyes:Pupils Equal Round Reactive to light, Extraocular movements intact,  Conjunctiva without redness, discharge or icterus. Neck/lymp/endocrine: Supple,left ant cervical lymphadenopathy CV: RRR  Chest: CTAB, no wheeze or crackles. Good air movement, normal resp effort.  Skin: no rashes, purpura or petechiae.  Neuro:  Normal gait. PERLA. EOMi. Alert. Oriented x3  No exam data present No results found. No results found for this or any previous visit (from the past 24 hour(s)).  Assessment/Plan: Angela Jordan is a 27 y.o. female present for OV for  Viral URI with  cough likely viral illness.  Rest, hydrate.  + flonase, mucinex (DM if cough), nettie pot or nasal saline. Tessalon perles for cough  Augmentin  Prescribed to start on Friday if not improving or sooner if worsening, take until completed if started Diflucan prescribed If cough present it can last up to 6-8 weeks.  F/U 2 weeks of not improved.     Reviewed expectations re: course of current medical issues.  Discussed self-management of symptoms.  Outlined signs and symptoms indicating need for more acute intervention.  Patient verbalized understanding and all questions were answered.  Patient received an After-Visit Summary.    No orders of the defined types were placed in this encounter.    Note is dictated utilizing voice recognition software. Although note has been proof read prior to signing, occasional typographical errors still can be missed. If any questions arise, please do not hesitate to call for verification.   electronically signed by:  Howard Pouch, DO  [place]

## 2017-05-12 ENCOUNTER — Ambulatory Visit: Payer: Self-pay

## 2017-05-12 MED ORDER — GUAIFENESIN-CODEINE 100-10 MG/5ML PO SYRP: 10.0000 mL | ORAL_SOLUTION | Freq: Three times a day (TID) | ORAL | 0 refills | Status: DC | PRN

## 2017-05-12 NOTE — Addendum Note (Signed)
Addended by: Howard Pouch A on: 05/12/2017 11:46 AM   Modules accepted: Orders

## 2017-05-12 NOTE — Telephone Encounter (Signed)
Patient notified and verbalized understanding. 

## 2017-05-12 NOTE — Telephone Encounter (Signed)
Printed cough syrup with codeine. She can pick up at front desk.  Please caution pt on codeine content of syrup can cause sedation. If sedation occurs, use OTC cough syrup throughout the day and this at night only. No refills will be provided.

## 2017-05-12 NOTE — Telephone Encounter (Signed)
Pt. Saw provider 05/07/17. States Lavella Lemons is not helping her cough. Requests something else be called in.

## 2017-05-19 ENCOUNTER — Ambulatory Visit: Payer: BLUE CROSS/BLUE SHIELD | Admitting: Gastroenterology

## 2017-05-29 ENCOUNTER — Encounter: Payer: Self-pay | Admitting: Obstetrics and Gynecology

## 2017-05-29 ENCOUNTER — Ambulatory Visit: Payer: BLUE CROSS/BLUE SHIELD | Admitting: Obstetrics and Gynecology

## 2017-05-29 NOTE — Progress Notes (Deleted)
27 y.o. G0P0000 Single Caucasian female here for annual exam.    PCP:     No LMP recorded. Patient is not currently having periods (Reason: IUD).           Sexually active: {yes no:314532}  The current method of family planning is {contraception:315051}.    Exercising: {yes no:314532}  {types:19826} Smoker:  {YES NO:22349}  Health Maintenance: Pap:  *** History of abnormal Pap:  {YES NO:22349} MMG:  *** Colonoscopy:  *** BMD:   ***  Result  *** TDaP:  *** Gardasil:   {YES NO:22349} HIV and Hep C: 05/16/16 Negative Screening Labs:  Hb today: ***, Urine today: ***   reports that  has never smoked. she has never used smokeless tobacco. She reports that she does not drink alcohol or use drugs.  Past Medical History:  Diagnosis Date  . Blood transfusion without reported diagnosis    at birth  . Constipation   . CONSTIPATION, CHRONIC 06/04/2007   Qualifier: Diagnosis of  By: Sherren Mocha MD, Jory Ee   . Dysmenorrhea   . Headache(784.0) 02/26/2010   Qualifier: Diagnosis of  By: Regis Bill MD, Standley Brooking   . Irregular menses    Dr Helane Rima    Past Surgical History:  Procedure Laterality Date  . TONSILLECTOMY  10/23/2016  . WISDOM TOOTH EXTRACTION      Current Outpatient Medications  Medication Sig Dispense Refill  . amoxicillin-clavulanate (AUGMENTIN) 875-125 MG tablet Take 1 tablet by mouth 2 (two) times daily. 20 tablet 0  . benzonatate (TESSALON) 100 MG capsule Take 1 capsule (100 mg total) by mouth 2 (two) times daily as needed for cough. 20 capsule 0  . guaiFENesin-codeine (ROBITUSSIN AC) 100-10 MG/5ML syrup Take 10 mLs by mouth 3 (three) times daily as needed for cough. 120 mL 0  . levonorgestrel (MIRENA) 20 MCG/24HR IUD 1 each by Intrauterine route once.     No current facility-administered medications for this visit.     Family History  Problem Relation Age of Onset  . Hypertension Father   . Diabetes Maternal Aunt   . Thyroid disease Paternal Aunt   . Diabetes Maternal  Grandmother   . Heart disease Maternal Grandmother   . Leukemia Maternal Grandfather   . Breast cancer Paternal Grandmother   . Lymphoma Paternal Grandmother     ROS:  Pertinent items are noted in HPI.  Otherwise, a comprehensive ROS was negative.  Exam:   There were no vitals taken for this visit.    General appearance: alert, cooperative and appears stated age Head: Normocephalic, without obvious abnormality, atraumatic Neck: no adenopathy, supple, symmetrical, trachea midline and thyroid normal to inspection and palpation Lungs: clear to auscultation bilaterally Breasts: normal appearance, no masses or tenderness, No nipple retraction or dimpling, No nipple discharge or bleeding, No axillary or supraclavicular adenopathy Heart: regular rate and rhythm Abdomen: soft, non-tender; no masses, no organomegaly Extremities: extremities normal, atraumatic, no cyanosis or edema Skin: Skin color, texture, turgor normal. No rashes or lesions Lymph nodes: Cervical, supraclavicular, and axillary nodes normal. No abnormal inguinal nodes palpated Neurologic: Grossly normal  Pelvic: External genitalia:  no lesions              Urethra:  normal appearing urethra with no masses, tenderness or lesions              Bartholins and Skenes: normal                 Vagina: normal appearing vagina with  normal color and discharge, no lesions              Cervix: no lesions              Pap taken: {yes no:314532} Bimanual Exam:  Uterus:  normal size, contour, position, consistency, mobility, non-tender              Adnexa: no mass, fullness, tenderness              Rectal exam: {yes no:314532}.  Confirms.              Anus:  normal sphincter tone, no lesions  Chaperone was present for exam.  Assessment:   Well woman visit with normal exam.   Plan: Mammogram screening discussed. Recommended self breast awareness. Pap and HR HPV as above. Guidelines for Calcium, Vitamin D, regular exercise program  including cardiovascular and weight bearing exercise.   Follow up annually and prn.   Additional counseling given.  {yes [id number]. _______ minutes face to face time of which over 50% was spent in counseling.    After visit summary provided.

## 2017-08-07 ENCOUNTER — Encounter: Payer: Self-pay | Admitting: Gastroenterology

## 2017-08-14 ENCOUNTER — Telehealth: Payer: Self-pay | Admitting: Family Medicine

## 2017-08-14 DIAGNOSIS — K589 Irritable bowel syndrome without diarrhea: Secondary | ICD-10-CM | POA: Diagnosis not present

## 2017-08-14 DIAGNOSIS — R112 Nausea with vomiting, unspecified: Secondary | ICD-10-CM | POA: Diagnosis not present

## 2017-08-14 DIAGNOSIS — N390 Urinary tract infection, site not specified: Secondary | ICD-10-CM | POA: Diagnosis not present

## 2017-08-14 DIAGNOSIS — R1031 Right lower quadrant pain: Secondary | ICD-10-CM | POA: Diagnosis not present

## 2017-08-14 DIAGNOSIS — K580 Irritable bowel syndrome with diarrhea: Secondary | ICD-10-CM

## 2017-08-14 DIAGNOSIS — R109 Unspecified abdominal pain: Secondary | ICD-10-CM | POA: Diagnosis not present

## 2017-08-14 DIAGNOSIS — R1032 Left lower quadrant pain: Secondary | ICD-10-CM | POA: Diagnosis not present

## 2017-08-14 DIAGNOSIS — F419 Anxiety disorder, unspecified: Secondary | ICD-10-CM | POA: Diagnosis not present

## 2017-08-14 DIAGNOSIS — R110 Nausea: Secondary | ICD-10-CM | POA: Diagnosis not present

## 2017-08-14 NOTE — Telephone Encounter (Signed)
Copied from [place] [phone] [phone]. Topic: Quick Communication - See Telephone Encounter >> Aug 14, 2017  3:28 PM Percell Belt A wrote: CRM for notification. See Telephone encounter for: 08/14/17.  Pt called in and state that [facility] can not ger her in til July and she would like to be seen sooner.  She would like to know if there is anywhere else she could go sooner   Best number [phone]

## 2017-08-15 NOTE — Telephone Encounter (Signed)
Patient no showed her original appt in February and the referral was closed . Do you want to place another referral the original was placed in December last year and that's the las time we saw her for IBS. Please advise

## 2017-08-15 NOTE — Telephone Encounter (Signed)
Referral can be placed again for another location if she desires. There is no guarantee any place will be able to get her in sooner though ([facility] unable to see her until July). Advise pt wherever she gets in, she needs ot make sure to go, or they will push her appt back.  Try [facility] and Dr. Collene Mares.

## 2017-08-18 NOTE — Telephone Encounter (Signed)
I was able to get an appointment at [facility] 09/02/17. Patient aware

## 2017-09-12 DIAGNOSIS — Z114 Encounter for screening for human immunodeficiency virus [HIV]: Secondary | ICD-10-CM | POA: Diagnosis not present

## 2017-09-12 DIAGNOSIS — Z113 Encounter for screening for infections with a predominantly sexual mode of transmission: Secondary | ICD-10-CM | POA: Diagnosis not present

## 2017-10-01 DIAGNOSIS — K582 Mixed irritable bowel syndrome: Secondary | ICD-10-CM | POA: Diagnosis not present

## 2017-10-01 DIAGNOSIS — R110 Nausea: Secondary | ICD-10-CM | POA: Diagnosis not present

## 2017-10-03 DIAGNOSIS — K1230 Oral mucositis (ulcerative), unspecified: Secondary | ICD-10-CM | POA: Diagnosis not present

## 2017-10-15 ENCOUNTER — Ambulatory Visit: Payer: BLUE CROSS/BLUE SHIELD | Admitting: Gastroenterology

## 2017-12-09 DIAGNOSIS — Z30431 Encounter for routine checking of intrauterine contraceptive device: Secondary | ICD-10-CM | POA: Diagnosis not present

## 2017-12-09 DIAGNOSIS — Z01419 Encounter for gynecological examination (general) (routine) without abnormal findings: Secondary | ICD-10-CM | POA: Diagnosis not present

## 2017-12-12 DIAGNOSIS — N760 Acute vaginitis: Secondary | ICD-10-CM | POA: Diagnosis not present

## 2017-12-12 DIAGNOSIS — Z30431 Encounter for routine checking of intrauterine contraceptive device: Secondary | ICD-10-CM | POA: Diagnosis not present

## 2017-12-29 DIAGNOSIS — Z114 Encounter for screening for human immunodeficiency virus [HIV]: Secondary | ICD-10-CM | POA: Diagnosis not present

## 2017-12-29 DIAGNOSIS — Z113 Encounter for screening for infections with a predominantly sexual mode of transmission: Secondary | ICD-10-CM | POA: Diagnosis not present

## 2017-12-29 DIAGNOSIS — Z30017 Encounter for initial prescription of implantable subdermal contraceptive: Secondary | ICD-10-CM | POA: Diagnosis not present

## 2018-01-06 DIAGNOSIS — Z30432 Encounter for removal of intrauterine contraceptive device: Secondary | ICD-10-CM | POA: Diagnosis not present

## 2018-05-04 ENCOUNTER — Ambulatory Visit: Payer: BLUE CROSS/BLUE SHIELD | Admitting: Family Medicine

## 2018-05-04 ENCOUNTER — Encounter: Payer: Self-pay | Admitting: Family Medicine

## 2018-05-04 VITALS — BP 115/74 | HR 99 | Temp 98.5°F | Resp 16 | Ht 62.0 in | Wt 165.0 lb

## 2018-05-04 DIAGNOSIS — G44209 Tension-type headache, unspecified, not intractable: Secondary | ICD-10-CM

## 2018-05-04 DIAGNOSIS — M26609 Unspecified temporomandibular joint disorder, unspecified side: Secondary | ICD-10-CM

## 2018-05-04 MED ORDER — KETOROLAC TROMETHAMINE 60 MG/2ML IM SOLN
60.0000 mg | Freq: Once | INTRAMUSCULAR | Status: AC
Start: 2018-05-04 — End: 2018-05-04
  Administered 2018-05-04: 60 mg via INTRAMUSCULAR

## 2018-05-04 MED ORDER — NAPROXEN 500 MG PO TABS: 500.0000 mg | ORAL_TABLET | Freq: Two times a day (BID) | ORAL | 0 refills | Status: DC

## 2018-05-04 NOTE — Progress Notes (Signed)
Angela Jordan , [birth year], 28 y.o., female MRN: [id number] Patient Care Team    Relationship Specialty Notifications Start End  Ma Hillock, DO PCP - General Family Medicine  11/15/14   Dian Queen, MD Attending Physician Obstetrics and Gynecology  02/01/12     Chief Complaint  Patient presents with  . Headache    x3 qd, will happen in the afternoon to evening, nothing improves sxs, it is pressure from her jaw up to scalp, sensitive to light.      Subjective: Pt presents for an OV with complaints of headache of 3 weeks  duration.  Associated symptoms include headache is relieved by sleep. It starts about noon if working and in the evening if not working. She reports pain is bilateral TMJ,  Temples and frontal headaches. She wears glasses, script is about 28 year old. 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 She denies sinus or URI symptoms. She denies nausea, vomit, dizziness or photophobia. She does endorse increase in stress at work. She is looking for a new job. She has found herself clenching her jaw.  She has a remote h/o Migraine diagnosis in the past- she states only once.   Pt has tried tylenol and advil to ease their symptoms.  Nexplanon  Placed 01/2018.  Depression screen [facility] 2/9 05/07/2017 04/07/2017 09/27/2014  Decreased Interest 0 0 0  Down, Depressed, Hopeless - 0 0  PHQ - 2 Score 0 0 0    No Known Allergies Social History   Tobacco Use  . Smoking status: Never Smoker  . Smokeless tobacco: Never Used  Substance Use Topics  . Alcohol use: No    Alcohol/week: 0.0 standard drinks   Past Medical History:  Diagnosis Date  . Blood transfusion without reported diagnosis    at birth  . Constipation   . CONSTIPATION, CHRONIC 06/04/2007   Qualifier: Diagnosis of  By: Sherren Mocha MD, Jory Ee   . Dysmenorrhea   . Headache(784.0) 02/26/2010   Qualifier: Diagnosis of  By: Regis Bill MD, Standley Brooking   . Irregular menses    Dr Helane Rima   Past Surgical  History:  Procedure Laterality Date  . TONSILLECTOMY  10/23/2016  . WISDOM TOOTH EXTRACTION     Family History  Problem Relation Age of Onset  . Hypertension Father   . Diabetes Maternal Aunt   . Thyroid disease Paternal Aunt   . Diabetes Maternal Grandmother   . Heart disease Maternal Grandmother   . Leukemia Maternal Grandfather   . Breast cancer Paternal Grandmother   . Lymphoma Paternal Grandmother    Allergies as of 05/04/2018   No Known Allergies     Medication List       Accurate as of May 04, 2018  9:57 AM. Always use your most recent med list.        naproxen 500 MG tablet Commonly known as:  NAPROSYN Take 1 tablet (500 mg total) by mouth 2 (two) times daily with a meal.   NEXPLANON [place] Inject into the skin.       All past medical history, surgical history, allergies, family history, immunizations andmedications were updated in the EMR today and reviewed under the history and medication portions of their EMR.     ROS: Negative, with the exception of above mentioned in HPI   Objective:  BP 115/74   Pulse 99   Temp 98.5 F (36.9 C) (Oral)   Resp 16   Ht 5\' 2"  (  1.575 m)   Wt 165 lb (74.8 kg)   SpO2 99%   BMI 30.18 kg/m  Body mass index is 30.18 kg/m. Gen: Afebrile. No acute distress. Nontoxic in appearance, well developed, well nourished.  HENT: AT. [place]. Bilateral TM visualized WNL. MMM, no oral lesions. Bilateral nares without erythema or swelling. Throat without erythema or exudates. No cough or hoarseness.  Eyes:Pupils Equal Round Reactive to light, Extraocular movements intact,  Conjunctiva without redness, discharge or icterus. Neck/lymp/endocrine: Supple,no lymphadenopathy CV: RRR Chest: CTAB, no wheeze or crackles. Good air movement, normal resp effort.  MSK: tightness bilateral trap. Clicking right TMJ. TTP bilateral TMJ.  Skin: no rashes, purpura or petechiae.  Neuro:  Normal gait. PERLA. EOMi. Alert. Oriented x3  Psych: Normal affect,  dress and demeanor. Normal speech. Normal thought content and judgment.  No exam data present No results found. No results found for this or any previous visit (from the past 24 hour(s)).  Assessment/Plan: Angela Jordan is a 28 y.o. female present for OV for  TMJ (temporomandibular joint syndrome)/Tension-type headache, not intractable, unspecified chronicity pattern - suspect stress and jaw clenching as cause. Provided toradol injection for today. Start naproxen BID for 7 days tomorrow.  - Hopefully this will break the cycle and take the inflammation in her TMJ. 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 - May need endodontist eval if felt teeth/jaw alignment as potential cause.  - encouraged to also add glare protection to glasses and make certain script is adequate to avoid eye strain as cause.  - ketorolac (TORADOL) injection 60 mg - f/u 2-4 weeks PRN   Reviewed expectations re: course of current medical issues.  Discussed self-management of symptoms.  Outlined signs and symptoms indicating need for more acute intervention.  Patient verbalized understanding and all questions were answered.  Patient received an After-Visit Summary.    No orders of the defined types were placed in this encounter.    Note is dictated utilizing voice recognition software. Although note has been proof read prior to signing, occasional typographical errors still can be missed. If any questions arise, please do not hesitate to call for verification.   electronically signed by:  Howard Pouch, DO  [place]

## 2018-05-04 NOTE — Patient Instructions (Signed)
Start naproxen tomorrow, every 12 hours with food, for 7 days. Then you can use PR after.   I believe this is from tension/stress and jaw clenching    Temporomandibular Joint Syndrome  Temporomandibular joint syndrome (TMJ syndrome) is a condition that causes pain in the temporomandibular joints. These joints are located near your ears and allow your jaw to open and close. For people with TMJ syndrome, chewing, biting, or other movements of the jaw can be difficult or painful. TMJ syndrome is often mild and goes away within a few weeks. However, sometimes the condition becomes a long-term (chronic) problem. What are the causes? This condition may be caused by:  Grinding your teeth or clenching your jaw. Some people do this when they are under stress.  Arthritis.  Injury to the jaw.  Head or neck injury.  Teeth or dentures that are not aligned well. In some cases, the cause of TMJ syndrome may not be known. What are the signs or symptoms? The most common symptom of this condition is an aching pain on the side of the head in the area of the TMJ. Other symptoms may include:  Pain when moving your jaw, such as when chewing or biting.  Being unable to open your jaw all the way.  Making a clicking sound when you open your mouth.  Headache.  Earache.  Neck or shoulder pain. How is this diagnosed? This condition may be diagnosed based on:  Your symptoms and medical history.  A physical exam. Your health care provider may check the range of motion of your jaw.  Imaging tests, such as X-rays or an MRI. You may also need to see your dentist, who will determine if your teeth and jaw are lined up correctly. How is this treated? TMJ syndrome often goes away on its own. If treatment is needed, the options may include:  Eating soft foods and applying ice or heat.  Medicines to relieve pain or inflammation.  Medicines or massage to relax the muscles.  A splint, bite plate, or  mouthpiece to prevent teeth grinding or jaw clenching.  Relaxation techniques or counseling to help reduce stress.  A therapy for pain in which an electrical current is applied to the nerves through the skin (transcutaneous electrical nerve stimulation).  Acupuncture. This is sometimes helpful to relieve pain.  Jaw surgery. This is rarely needed. Follow these instructions at home:  Eating and drinking  Eat a soft diet if you are having trouble chewing.  Avoid foods that require a lot of chewing. Do not chew gum. General instructions  Take over-the-counter and prescription medicines only as told by your health care provider.  If directed, put ice on the painful area. ? Put ice in a plastic bag. ? Place a towel between your skin and the bag. ? Leave the ice on for 20 minutes, 2-3 times a day.  Apply a warm, wet cloth (warm compress) to the painful area as directed.  Massage your jaw area and do any jaw stretching exercises as told by your health care provider.  If you were given a splint, bite plate, or mouthpiece, wear it as told by your health care provider.  Keep all follow-up visits as told by your health care provider. This is important. Contact a health care provider if:  You are having trouble eating.  You have new or worsening symptoms. Get help right away if:  Your jaw locks open or closed. Summary  Temporomandibular joint syndrome (TMJ syndrome) is  a condition that causes pain in the temporomandibular joints. These joints are located near your ears and allow your jaw to open and close.  TMJ syndrome is often mild and goes away within a few weeks. However, sometimes the condition becomes a long-term (chronic) problem.  Symptoms include an aching pain on the side of the head in the area of the TMJ, pain when chewing or biting, and being unable to open your jaw all the way. You may also make a clicking sound when you open your mouth.  TMJ syndrome often goes away  on its own. If treatment is needed, it may include medicines to relieve pain, reduce inflammation, or relax the muscles. A splint, bite plate, or mouthpiece may also be used to prevent teeth grinding or jaw clenching. This information is not intended to replace advice given to you by your health care provider. Make sure you discuss any questions you have with your health care provider. Document Released: 12/18/2000 Document Revised: 05/06/2017 Document Reviewed: 05/06/2017 Elsevier Interactive Patient Education  2019 [employer].

## 2018-05-07 ENCOUNTER — Encounter: Payer: Self-pay | Admitting: Family Medicine

## 2018-05-07 ENCOUNTER — Ambulatory Visit: Payer: BLUE CROSS/BLUE SHIELD | Admitting: Family Medicine

## 2018-05-07 ENCOUNTER — Other Ambulatory Visit: Payer: Self-pay

## 2018-05-07 VITALS — BP 100/52 | HR 71 | Temp 98.9°F | Resp 16 | Ht 62.0 in | Wt 165.0 lb

## 2018-05-07 DIAGNOSIS — B9789 Other viral agents as the cause of diseases classified elsewhere: Secondary | ICD-10-CM | POA: Diagnosis not present

## 2018-05-07 DIAGNOSIS — J218 Acute bronchiolitis due to other specified organisms: Secondary | ICD-10-CM

## 2018-05-07 MED ORDER — GUAIFENESIN-CODEINE 100-10 MG/5ML PO SOLN: 5.0000 mL | Freq: Four times a day (QID) | ORAL | 0 refills | Status: DC | PRN

## 2018-05-07 NOTE — Patient Instructions (Signed)
Please follow up if symptoms do not improve or as needed.   You may use Delsym cough syrup or Mucinex DM to help with congestion and coughing.   Acute Bronchitis, Adult  Acute bronchitis is sudden (acute) swelling of the air tubes (bronchi) in the lungs. Acute bronchitis causes these tubes to fill with mucus, which can make it hard to breathe. It can also cause coughing or wheezing. In adults, acute bronchitis usually goes away within 2 weeks. A cough caused by bronchitis may last up to 3 weeks. Smoking, allergies, and asthma can make the condition worse. Repeated episodes of bronchitis may cause further lung problems, such as chronic obstructive pulmonary disease (COPD). What are the causes? This condition can be caused by germs and by substances that irritate the lungs, including:  Cold and flu viruses. This condition is most often caused by the same virus that causes a cold.  Bacteria.  Exposure to tobacco smoke, dust, fumes, and air pollution. What increases the risk? This condition is more likely to develop in people who:  Have close contact with someone with acute bronchitis.  Are exposed to lung irritants, such as tobacco smoke, dust, fumes, and vapors.  Have a weak immune system.  Have a respiratory condition such as asthma. What are the signs or symptoms? Symptoms of this condition include:  A cough.  Coughing up clear, yellow, or green mucus.  Wheezing.  Chest congestion.  Shortness of breath.  A fever.  Body aches.  Chills.  A sore throat. How is this diagnosed? This condition is usually diagnosed with a physical exam. During the exam, your health care provider may order tests, such as chest X-rays, to rule out other conditions. He or she may also:  Test a sample of your mucus for bacterial infection.  Check the level of oxygen in your blood. This is done to check for pneumonia.  Do a chest X-ray or lung function testing to rule out pneumonia and other  conditions.  Perform blood tests. Your health care provider will also ask about your symptoms and medical history. How is this treated? Most cases of acute bronchitis clear up over time without treatment. Your health care provider may recommend:  Drinking more fluids. Drinking more makes your mucus thinner, which may make it easier to breathe.  Taking a medicine for a fever or cough.  Taking an antibiotic medicine.  Using an inhaler to help improve shortness of breath and to control a cough.  Using a cool mist vaporizer or humidifier to make it easier to breathe. Follow these instructions at home: Medicines  Take over-the-counter and prescription medicines only as told by your health care provider.  If you were prescribed an antibiotic, take it as told by your health care provider. Do not stop taking the antibiotic even if you start to feel better. General instructions   Get plenty of rest.  Drink enough fluids to keep your urine pale yellow.  Avoid smoking and secondhand smoke. Exposure to cigarette smoke or irritating chemicals will make bronchitis worse. If you smoke and you need help quitting, ask your health care provider. Quitting smoking will help your lungs heal faster.  Use an inhaler, cool mist vaporizer, or humidifier as told by your health care provider.  Keep all follow-up visits as told by your health care provider. This is important. How is this prevented? To lower your risk of getting this condition again:  Wash your hands often with soap and water. If soap and  water are not available, use hand sanitizer.  Avoid contact with people who have cold symptoms.  Try not to touch your hands to your mouth, nose, or eyes.  Make sure to get the flu shot every year. Contact a health care provider if:  Your symptoms do not improve in 2 weeks of treatment. Get help right away if:  You cough up blood.  You have chest pain.  You have severe shortness of  breath.  You become dehydrated.  You faint or keep feeling like you are going to faint.  You keep vomiting.  You have a severe headache.  Your fever or chills gets worse. This information is not intended to replace advice given to you by your health care provider. Make sure you discuss any questions you have with your health care provider. Document Released: 05/02/2004 Document Revised: 11/06/2016 Document Reviewed: 09/13/2015 Elsevier Interactive Patient Education  2019 [employer].

## 2018-05-07 NOTE — Progress Notes (Signed)
Subjective  CC:  Chief Complaint  Patient presents with  . URI    Started 05/03/18. Has tried OTW medication with minimal relief   Same day acute visit; PCP not available. New pt to me. Chart reviewed.   HPI: SUBJECTIVE:  Angela Jordan is a 28 y.o. female who complains of congestion, nasal blockage, post nasal drip, cough described as dry and paroxysmal with upper airway "burning" sensation but w/o pleuritic cp and denies sinus, high fevers, SOB, chest pain or significant GI symptoms. Symptoms have been present for 4-5 ays. She denies a history of anorexia, dizziness, vomiting and wheezing. She denies a history of asthma or COPD. Patient does not smoke cigarettes. Multiple exposures to sick contacts with similar illnesses. Using dayquil and nyquil.  Assessment  1. Acute viral bronchiolitis      Plan  Discussion:  Likely viral; discussed sxs and typical course. Treat with cough meds. See avs.  Education regarding differences between viral and bacterial infections and treatment options are discussed.  Supportive care measures are recommended.  We discussed the use of mucolytic's, decongestants, antihistamines and antitussives as needed.  Tylenol or Advil are recommended if needed.  Follow up: prn   No orders of the defined types were placed in this encounter.  Meds ordered this encounter  Medications  . guaiFENesin-codeine 100-10 MG/5ML syrup    Sig: Take 5 mLs by mouth every 6 (six) hours as needed for cough.    Dispense:  120 mL    Refill:  0      I reviewed the patients updated PMH, FH, and SocHx.  Social History: Patient  reports that she has never smoked. She has never used smokeless tobacco. She reports that she does not drink alcohol or use drugs.  Patient Active Problem List   Diagnosis Date Noted  . IBS (irritable bowel syndrome) 08/31/2015  . Pharyngitis 01/09/2015  . Generalized anxiety disorder 11/15/2014  . Health care maintenance 11/15/2014  . Goiter  possible 02/01/2012  . Visit for preventive health examination 02/01/2012    Review of Systems: Cardiovascular: negative for chest pain Respiratory: negative for SOB or hemoptysis Gastrointestinal: negative for abdominal pain Genitourinary: negative for dysuria or gross hematuria Current Meds  Medication Sig  . Etonogestrel (NEXPLANON ) Inject into the skin.  . naproxen (NAPROSYN) 500 MG tablet Take 1 tablet (500 mg total) by mouth 2 (two) times daily with a meal.    Objective  Vitals: BP (!) 100/52   Pulse 71   Temp 98.9 F (37.2 C) (Oral)   Resp 16   Ht 5\' 2"  (1.575 m)   Wt 165 lb (74.8 kg)   SpO2 98%   BMI 30.18 kg/m  General: no acute distress but with occ light coughing Psych:  Alert and oriented, normal mood and affect HEENT:  Normocephalic, atraumatic, supple neck, moist mucous membranes, mildly erythematous pharynx without exudate, mild lymphadenopathy, supple neck Cardiovascular:  RRR without murmur. no edema Respiratory:  Good breath sounds bilaterally, CTAB with normal respiratory effort without wheezing, rales or rhonchi Skin:  Warm, no rashes Neurologic:   Mental status is normal. normal gait  Commons side effects, risks, benefits, and alternatives for medications and treatment plan prescribed today were discussed, and the patient expressed understanding of the given instructions. Patient is instructed to call or message via MyChart if he/she has any questions or concerns regarding our treatment plan. No barriers to understanding were identified. We discussed Red Flag symptoms and signs in detail. Patient  expressed understanding regarding what to do in case of urgent or emergency type symptoms.  Medication list was reconciled, printed and provided to the patient in AVS. Patient instructions and summary information was reviewed with the patient as documented in the AVS. This note was prepared with assistance of Dragon voice recognition software. Occasional wrong-word  or sound-a-like substitutions may have occurred due to the inherent limitations of voice recognition software

## 2018-05-12 ENCOUNTER — Ambulatory Visit: Payer: BLUE CROSS/BLUE SHIELD | Admitting: Family Medicine

## 2018-05-12 ENCOUNTER — Encounter: Payer: Self-pay | Admitting: Family Medicine

## 2018-05-12 ENCOUNTER — Ambulatory Visit: Payer: Self-pay

## 2018-05-12 VITALS — BP 116/77 | HR 64 | Temp 98.1°F | Resp 16 | Ht 62.0 in | Wt 166.1 lb

## 2018-05-12 DIAGNOSIS — R05 Cough: Secondary | ICD-10-CM | POA: Diagnosis not present

## 2018-05-12 DIAGNOSIS — J209 Acute bronchitis, unspecified: Secondary | ICD-10-CM | POA: Diagnosis not present

## 2018-05-12 DIAGNOSIS — R059 Cough, unspecified: Secondary | ICD-10-CM

## 2018-05-12 MED ORDER — METHYLPREDNISOLONE ACETATE 80 MG/ML IJ SUSP
80.0000 mg | Freq: Once | INTRAMUSCULAR | Status: AC
  Administered 2018-05-12: 80 mg via INTRAMUSCULAR

## 2018-05-12 MED ORDER — BENZONATATE 100 MG PO CAPS: 200.0000 mg | ORAL_CAPSULE | Freq: Two times a day (BID) | ORAL | 0 refills | Status: DC | PRN

## 2018-05-12 MED ORDER — DOXYCYCLINE HYCLATE 100 MG PO TABS: 100.0000 mg | ORAL_TABLET | Freq: Two times a day (BID) | ORAL | 0 refills | Status: DC

## 2018-05-12 NOTE — Telephone Encounter (Signed)
Pt called to say that she is feeling SOB.  She has been treated for Bronchitis last week and states that her symptoms are worse.  She states that she is SOB when resting but is able to lye down without problems.  She has a dry cough and sore throat that are troublesome. She denies fever. She has been using an inhaler that she received for another issue.  She denies a DX of asthma stating that she was told a few years ago that she had symptoms. She denies wheezing. Appointment scheduled per protocol. Care advice read to patient. Pt verbalized understanding of all instructions. Reason for Disposition . [1] MILD difficulty breathing (e.g., minimal/no SOB at rest, SOB with walking, pulse <100) AND [2] NEW-onset or WORSE than normal  Answer Assessment - Initial Assessment Questions 1. RESPIRATORY STATUS: "Describe your breathing?" (e.g., wheezing, shortness of breath, unable to speak, severe coughing)      SOB 2. ONSET: "When did this breathing problem begin?"      Last week has gotten worse 3. PATTERN "Does the difficult breathing come and go, or has it been constant since it started?"      constant 4. SEVERITY: "How bad is your breathing?" (e.g., mild, moderate, severe)    - MILD: No SOB at rest, mild SOB with walking, speaks normally in sentences, can lay down, no retractions, pulse < 100.    - MODERATE: SOB at rest, SOB with minimal exertion and prefers to sit, cannot lie down flat, speaks in phrases, mild retractions, audible wheezing, pulse 100-120.    - SEVERE: Very SOB at rest, speaks in single words, struggling to breathe, sitting hunched forward, retractions, pulse > 120      Moderate  5. RECURRENT SYMPTOM: "Have you had difficulty breathing before?" If so, ask: "When was the last time?" and "What happened that time?"      no 6. CARDIAC HISTORY: "Do you have any history of heart disease?" (e.g., heart attack, angina, bypass surgery, angioplasty)      no 7. LUNG HISTORY: "Do you have any  history of lung disease?"  (e.g., pulmonary embolus, asthma, emphysema)     Asthma symptoms years ago 8. CAUSE: "What do you think is causing the breathing problem?"      bronchitis 9. OTHER SYMPTOMS: "Do you have any other symptoms? (e.g., dizziness, runny nose, cough, chest pain, fever)     Lightheaded, cough,sore throat 10. PREGNANCY: "Is there any chance you are pregnant?" "When was your last menstrual period?"       No implant 11. TRAVEL: "Have you traveled out of the country in the last month?" (e.g., travel history, exposures)       no  Protocols used: BREATHING DIFFICULTY-A-AH

## 2018-05-12 NOTE — Progress Notes (Signed)
Marland KitchenMICHAELA Jordan , [birth year], 28 y.o., female MRN: [id number] Patient Care Team    Relationship Specialty Notifications Start End  Ma Hillock, DO PCP - General Family Medicine  11/15/14   Dian Queen, MD Attending Physician Obstetrics and Gynecology  02/01/12     Chief Complaint  Patient presents with  . Cough    Non productive, shotness of breath, x1 week. Pt went to [facility] last week for sick visit and medications have not been helpful.   . Nasal Congestion    Woke up this AM with nasal congestion   . Fatigue     Subjective:  Angela Jordan is a 28 y.o. female present today for worsening episode of bronchitis despite supportive therapy. She was seen by another provider last week for similar symptoms. She states her shortness of breath remains. She endorses burning in her chest with cough, nonproductive cough, nasal congestion, fatigue, hoarseness, headache and fever/chills/sweats.  Of note her TMJ pain is improved with naproxen. Seen 2 weeks by this provider for TMJ. She has been using mucinex, cough syrup and nsaids.  No LMP recorded. Patient has had an implant.  .  Depression screen [facility] 2/9 05/07/2017 04/07/2017 09/27/2014  Decreased Interest 0 0 0  Down, Depressed, Hopeless - 0 0  PHQ - 2 Score 0 0 0    No Known Allergies Social History   Tobacco Use  . Smoking status: Never Smoker  . Smokeless tobacco: Never Used  Substance Use Topics  . Alcohol use: No    Alcohol/week: 0.0 standard drinks   Past Medical History:  Diagnosis Date  . Blood transfusion without reported diagnosis    at birth  . Constipation   . CONSTIPATION, CHRONIC 06/04/2007   Qualifier: Diagnosis of  By: Sherren Mocha MD, Jory Ee   . Dysmenorrhea   . Headache(784.0) 02/26/2010   Qualifier: Diagnosis of  By: Regis Bill MD, Standley Brooking   . Irregular menses    Dr Helane Rima   Past Surgical History:  Procedure Laterality Date  . TONSILLECTOMY  10/23/2016  . WISDOM TOOTH EXTRACTION       Family History  Problem Relation Age of Onset  . Hypertension Father   . Diabetes Maternal Aunt   . Thyroid disease Paternal Aunt   . Diabetes Maternal Grandmother   . Heart disease Maternal Grandmother   . Leukemia Maternal Grandfather   . Breast cancer Paternal Grandmother   . Lymphoma Paternal Grandmother    Allergies as of 05/12/2018   No Known Allergies     Medication List       Accurate as of May 12, 2018 10:32 AM. Always use your most recent med list.        DELSYM COUGH/CHEST CONGEST DM 5-100 MG/5ML Liqd Generic drug:  Dextromethorphan-guaiFENesin Take by mouth.   naproxen 500 MG tablet Commonly known as:  NAPROSYN Take 1 tablet (500 mg total) by mouth 2 (two) times daily with a meal.   NEXPLANON [place] Inject into the skin.       All past medical history, surgical history, allergies, family history, immunizations andmedications were updated in the EMR today and reviewed under the history and medication portions of their EMR.     ROS: Negative, with the exception of above mentioned in HPI   Objective:  BP 116/77 (BP Location: Left Arm, Patient Position: Sitting, Cuff Size: Normal)   Pulse 64   Temp 98.1 F (36.7 C) (Oral)   Resp 16  Ht 5\' 2"  (1.575 m)   Wt 166 lb 2 oz (75.4 kg)   SpO2 98%   BMI 30.38 kg/m  Body mass index is 30.38 kg/m.  Gen: Afebrile. No acute distress. Nontoxic. Persistent cough present.  HENT: AT. [place]. Bilateral TM visualized and normal in appearance. MMM. Bilateral nares with mild erythema and drainage. Throat with mild erythema, no  exudates. Cough and hoarseness present Eyes:Pupils Equal Round Reactive to light, Extraocular movements intact,  Conjunctiva without redness, discharge or icterus. Neck/lymp/endocrine: Supple,no lymphadenopathy CV: RRR  Chest: CTAB, no wheeze or crackles Skin: no rashes, purpura or petechiae.  Neuro:  Normal gait. PERLA. EOMi. Alert. Oriented x3 No exam data present No results found. No  results found for this or any previous visit (from the past 24 hour(s)).  Assessment/Plan: [name] is a 28 y.o. female present for OV for  Bronchitis/Cough - extended case of bronchitis- elect to treat with abx.  - doxycycline (VIBRA-TABS) 100 MG tablet; Take 1 tablet (100 mg total) by mouth 2 (two) times daily.  Dispense: 20 tablet; Refill: 0 - benzonatate (TESSALON) 100 MG capsule; Take 2 capsules (200 mg total) by mouth 2 (two) times daily as needed for cough.  Dispense: 20 capsule Rest, hydrate.  + flonase, mucinex (DM if cough),  or nasal saline.  Doxycyline and tessalon perles prescribed, take until completed.  IM depo medrol 80 If cough present it can last up to 6-8 weeks.  F/U 2 weeks of not improved.     Reviewed expectations re: course of current medical issues.  Discussed self-management of symptoms.  Outlined signs and symptoms indicating need for more acute intervention.  Patient verbalized understanding and all questions were answered.  Patient received an After-Visit Summary.    No orders of the defined types were placed in this encounter.    Note is dictated utilizing voice recognition software. Although note has been proof read prior to signing, occasional typographical errors still can be missed. If any questions arise, please do not hesitate to call for verification.   electronically signed by:  Howard Pouch, DO  [place]

## 2018-05-12 NOTE — Patient Instructions (Signed)
Rest, hydrate.  + flonase, mucinex (DM if cough),  or nasal saline.  Doxycyline and tessalon perles prescribed, take until completed.  If cough present it can last up to 6-8 weeks.  F/U 2 weeks of not improved.     Acute Bronchitis, Adult Acute bronchitis is when air tubes (bronchi) in the lungs suddenly get swollen. The condition can make it hard to breathe. It can also cause these symptoms:  A cough.  Coughing up clear, yellow, or green mucus.  Wheezing.  Chest congestion.  Shortness of breath.  A fever.  Body aches.  Chills.  A sore throat. Follow these instructions at home:  Medicines  Take over-the-counter and prescription medicines only as told by your doctor.  If you were prescribed an antibiotic medicine, take it as told by your doctor. Do not stop taking the antibiotic even if you start to feel better. General instructions  Rest.  Drink enough fluids to keep your pee (urine) pale yellow.  Avoid smoking and secondhand smoke. If you smoke and you need help quitting, ask your doctor. Quitting will help your lungs heal faster.  Use an inhaler, cool mist vaporizer, or humidifier as told by your doctor.  Keep all follow-up visits as told by your doctor. This is important. How is this prevented? To lower your risk of getting this condition again:  Wash your hands often with soap and water. If you cannot use soap and water, use hand sanitizer.  Avoid contact with people who have cold symptoms.  Try not to touch your hands to your mouth, nose, or eyes.  Make sure to get the flu shot every year. Contact a doctor if:  Your symptoms do not get better in 2 weeks. Get help right away if:  You cough up blood.  You have chest pain.  You have very bad shortness of breath.  You become dehydrated.  You faint (pass out) or keep feeling like you are going to pass out.  You keep throwing up (vomiting).  You have a very bad headache.  Your fever or chills  gets worse. This information is not intended to replace advice given to you by your health care provider. Make sure you discuss any questions you have with your health care provider. Document Released: 09/11/2007 Document Revised: 11/06/2016 Document Reviewed: 09/13/2015 Elsevier Interactive Patient Education  2019 [employer].

## 2018-06-10 ENCOUNTER — Encounter: Payer: Self-pay | Admitting: Family Medicine

## 2018-06-10 ENCOUNTER — Ambulatory Visit: Payer: BLUE CROSS/BLUE SHIELD | Admitting: Family Medicine

## 2018-06-10 VITALS — BP 104/70 | HR 73 | Temp 97.7°F | Resp 16 | Ht 62.0 in | Wt 164.8 lb

## 2018-06-10 DIAGNOSIS — R51 Headache: Secondary | ICD-10-CM

## 2018-06-10 DIAGNOSIS — G44219 Episodic tension-type headache, not intractable: Secondary | ICD-10-CM

## 2018-06-10 DIAGNOSIS — G43001 Migraine without aura, not intractable, with status migrainosus: Secondary | ICD-10-CM

## 2018-06-10 DIAGNOSIS — R519 Headache, unspecified: Secondary | ICD-10-CM

## 2018-06-10 MED ORDER — RIZATRIPTAN BENZOATE 10 MG PO TABS: 10.0000 mg | ORAL_TABLET | ORAL | 0 refills | Status: DC | PRN

## 2018-06-10 MED ORDER — TOPIRAMATE 25 MG PO TABS: ORAL_TABLET | ORAL | 0 refills | Status: DC

## 2018-06-10 MED ORDER — KETOROLAC TROMETHAMINE 60 MG/2ML IM SOLN
60.0000 mg | Freq: Once | INTRAMUSCULAR | Status: AC
  Administered 2018-06-10: 60 mg via INTRAMUSCULAR

## 2018-06-10 NOTE — Progress Notes (Signed)
OFFICE VISIT  06/10/2018   CC:  Chief Complaint  Patient presents with  . Migraine    continuous   HPI:    Patient is a 28 y.o. Caucasian female who presents for headaches. Onset of HA's approx 5 wks ago. Forehead and temples, only occ back of head, throbbing character, pressure behind eyes. No aura.  Has had daily HA over this time, usually happens after she gets to work BUT last few days she wakes up in morning with a HA.  No HA's awakening her in the night. 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  Last 3d,  +nausea w/out vomiting, photophobia. Appetite down, focus/concentration down. Has tried caffeine, eating, extra hydration, ibup/aleve/tyl but nothing helps. No focal weakness.  No vision abnormalities.   On two occasions in the past she has required toradol injections for HA.. One in the last 5 wks and one about 5-6 yrs ago.  No new stressors or increased stress.  No new dietary habits, exercise habits (doesn't exercise), or sleep pattern. Gets 7-8 hours of sleep at night.  No new meds. LMP around 05/22/18.  Her menses are starting to become irreg again b/c she just switched to nexplanon starting 01/2018.    Past Medical History:  Diagnosis Date  . Blood transfusion without reported diagnosis    at birth  . Constipation   . CONSTIPATION, CHRONIC 06/04/2007   Qualifier: Diagnosis of  By: Sherren Mocha MD, Jory Ee   . Dysmenorrhea   . Headache(784.0) 02/26/2010   Qualifier: Diagnosis of  By: Regis Bill MD, Standley Brooking   . Irregular menses    Dr Helane Rima    Past Surgical History:  Procedure Laterality Date  . TONSILLECTOMY  10/23/2016  . WISDOM TOOTH EXTRACTION      Outpatient Medications Prior to Visit  Medication Sig Dispense Refill  . benzonatate (TESSALON) 100 MG capsule Take 2 capsules (200 mg total) by mouth 2 (two) times daily as needed for cough. 20 capsule 0  .  Dextromethorphan-guaiFENesin (DELSYM COUGH/CHEST CONGEST DM) 5-100 MG/5ML LIQD Take by mouth.    . Etonogestrel (NEXPLANON [place]) Inject into the skin.    . naproxen (NAPROSYN) 500 MG tablet Take 1 tablet (500 mg total) by mouth 2 (two) times daily with a meal. (Patient not taking: Reported on 06/10/2018) 30 tablet 0  . doxycycline (VIBRA-TABS) 100 MG tablet Take 1 tablet (100 mg total) by mouth 2 (two) times daily. 20 tablet 0   No facility-administered medications prior to visit.     No Known Allergies  ROS As per HPI  PE: Blood pressure 104/70, pulse 73, temperature 97.7 F (36.5 C), temperature source Oral, resp. rate 16, height 5\' 2"  (1.575 m), weight 164 lb 12.8 oz (74.8 kg), SpO2 98 %. Gen: Alert, well appearing.  Patient is oriented to person, place, time, and situation. AFFECT: pleasant, lucid thought and speech. JKK:XFGH: no injection, icteris, swelling, or exudate.  EOMI, PERRLA. NO TMJ tenderness or subluxation on either side today. Mouth: lips without lesion/swelling.  Oral mucosa pink and moist. Oropharynx without erythema, exudate, or swelling.  CV: RRR, no m/r/g.   LUNGS: CTA bilat, nonlabored resps, good aeration in all lung fields. EXT: no clubbing or cyanosis.  no edema.  Neuro: CN 2-12 intact bilaterally, strength 5/5 in proximal and distal upper extremities and lower extremities bilaterally.  No sensory deficits.  No tremor.  No disdiadochokinesis.  No ataxia.  Upper extremity and lower extremity DTRs symmetric.  No pronator drift.   LABS:    Chemistry      Component Value Date/Time   NA 138 08/31/2015 1158   K 4.1 08/31/2015 1158   CL 106 08/31/2015 1158   CO2 26 08/31/2015 1158   BUN 11 08/31/2015 1158   CREATININE 0.72 08/31/2015 1158   CREATININE 0.63 04/18/2014 1512      Component Value Date/Time   CALCIUM 10.2 08/31/2015 1158   ALKPHOS 71 08/31/2015 1158   AST 11 08/31/2015 1158   ALT 9 08/31/2015 1158   BILITOT 0.5 08/31/2015 1158     Lab Results   Component Value Date   WBC 7.9 08/31/2015   HGB 15.0 08/31/2015   HCT 44.9 08/31/2015   MCV 88.8 08/31/2015   PLT 379.0 08/31/2015   Lab Results  Component Value Date   TSH 1.45 08/19/2014    IMPRESSION AND PLAN:  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 No red flags for intracranial pathology. Most of her HA's are tension type, but some are definitely more c/w migraines. Toradol 60mg  IM today. Maxalt 10mg  rx'd for abortive HA med. Start topamax 25mg  qd x 7d, then increase to 25mg  bid. See in 2 wks to assess and potentially continue up-titrating topamax. Therapeutic expectations and side effect profile of medications discussed today.  Patient's questions answered.   An After Visit Summary was printed and given to the patient.  FOLLOW UP: Return in about 2 weeks (around 06/24/2018) for f/u headaches with Dr. Raoul Pitch or myself if necessary.  Signed:  Crissie Sickles, MD           06/10/2018

## 2018-06-24 ENCOUNTER — Telehealth: Payer: Self-pay

## 2018-06-24 NOTE — Telephone Encounter (Signed)
Pt stated she was just informed by her insurance company that a prior authorization is needed for the rizatriptan (MAXALT) 10 MG tablet.

## 2018-06-24 NOTE — Telephone Encounter (Signed)
Prior Auth completed

## 2018-06-24 NOTE — Telephone Encounter (Signed)
Pt called and stated she has picked up the Topiramate and it is not really helping with her headaches but she was unable to pick up the Maxalt due to cost. Pt was called and told to contact insurance company to find out what medication would be covered. Pt verbalized understanding.

## 2018-06-29 NOTE — Telephone Encounter (Addendum)
Angela Jordan with [facility] left voice main or refill line  to see if maxalt suppose to be generic or name brand. Please return Angela Jordan call

## 2018-06-29 NOTE — Telephone Encounter (Signed)
Called patient and LM for her to return call as to whether she had gotten the Maxalt from the pharmacy or not. No phone number left for [employer] rep by [facility]. Unable to return call.

## 2018-11-02 ENCOUNTER — Telehealth: Payer: Self-pay | Admitting: Obstetrics and Gynecology

## 2018-11-02 NOTE — Telephone Encounter (Signed)
Patient is calling regarding abnormal bleeding. Patient stated that her cycle started last Monday. Patient stated that on Friday, the bleeding became heavy and she began passing blood clots. Patient stated that she is currently soaking through a super tampon every hour.

## 2018-11-02 NOTE — Telephone Encounter (Signed)
Spoke with patient, advised as seen below per Dr. Quincy Simmonds. Patient request to schedule OV for 7/29 at 4:30pm. OV scheduled, ER precautions reviewed. [id number] prescreen negative, precautions reviewed. Patient verbalizes understanding and is agreeable.   Routing to provider for final review. Patient is agreeable to disposition. Will close encounter.

## 2018-11-02 NOTE — Telephone Encounter (Signed)
Patient returned call. Patient reports feeling "shaky", dizzy,  weak and intermittent SHOB. Patient states she is a smoker. Works in [place], is approximately 1 hr [address] drive. Advised patient to go directly to ER for further evaluation, do not drive if feeling dizzy or weak. Patient states she would prefer to be seen in office. Advised patient Dr. Quincy Simmonds is in the OR, will return to office this afternoon, due to symptoms you are experiencing would not recommend driving, recommend evaluation at local ER. Advised I will forward to Dr. Quincy Simmonds and return call if any additional recommendations, patient agreeable.   Dr. Quincy Simmonds -please review.

## 2018-11-02 NOTE — Telephone Encounter (Signed)
I am happy to see her on 11/04/18 at 4:30 pm.  She may want to consider going to [facility] or the local ER for more immediate care.

## 2018-11-02 NOTE — Telephone Encounter (Signed)
Spoke with patient. LMP 10/26/18, menses has not stopped. Bleeding increased on 7/24 to changing saturated super plus tampon q1h, soaking thru clothing. Clots with bleeding. Reports fatigue, nausea and decreased appetite. [facility] Reports menses have been occurring regularly, but have been increasingly getting heavier. Cramps 4/10 with heating pad. Patient stated she is at work and will have to return call, call ended by patient.

## 2018-11-02 NOTE — Telephone Encounter (Signed)
Left message to call Sharee Pimple, RN at [facility].   Last AEX 05/16/16 Dr. Quincy Simmonds

## 2018-11-04 ENCOUNTER — Other Ambulatory Visit: Payer: Self-pay

## 2018-11-04 ENCOUNTER — Encounter: Payer: Self-pay | Admitting: Obstetrics and Gynecology

## 2018-11-04 ENCOUNTER — Ambulatory Visit: Payer: BC Managed Care – PPO | Admitting: Obstetrics and Gynecology

## 2018-11-04 VITALS — BP 108/60 | HR 84 | Temp 98.2°F | Resp 12 | Ht 62.0 in | Wt 137.0 lb

## 2018-11-04 DIAGNOSIS — N921 Excessive and frequent menstruation with irregular cycle: Secondary | ICD-10-CM

## 2018-11-04 LAB — POCT URINE PREGNANCY: Preg Test, Ur: NEGATIVE

## 2018-11-04 LAB — HEMOGLOBIN: Hemoglobin: 10.3

## 2018-11-04 NOTE — Progress Notes (Signed)
GYNECOLOGY  VISIT   HPI: 28 y.o.   Single  Caucasian  female   [id number] with Patient's last menstrual period was 10/26/2018.   here for heavy bleeding with Nexplanon. Patient states that she started her period 10/26/18 and the heavy bleeding 10/30/18. Per patient soaking a tampon every hour with clots and bleeding through clothes. Bleeding has slowed some and patient now changes tampon every 3-4 hours.   [facility], she has had problems with prolonged bleeding.  She took birth control pills for one month.  In May, she took estradiol pills to try to regulate her menses.   She had emotional changes, so she stopped taking the estradiol before completing 30 days of treatment.   Had heavy clotting and bleeding through a super tampon every hour on 10/30/18.  She had some cramping also.  Used a heating pad.   No need for pain medication.   She needs pregnancy prevention.   She is interested in Depo Provera.  Declines an IUD.  She had problems in the past.  Declines taking something with estrogen.   STD testing in February.  No change in partners since then.   She is in the process of changing her insurance at the end of this week.  UPT:  neg  GYNECOLOGIC HISTORY: Patient's last menstrual period was 10/26/2018. Contraception:  Nexplanon placed in October 2019.  Menopausal hormone therapy:  n/a Last mammogram:  n/a Last pap smear:   04-18-14 [facility]:Neg HR HPV.  Pap normal in 2019.         OB History    Gravida  0   Para  0   Term  0   Preterm  0   AB  0   Living  0     SAB  0   TAB  0   Ectopic  0   Multiple  0   Live Births                 Patient Active Problem List   Diagnosis Date Noted  . IBS (irritable bowel syndrome) 08/31/2015  . Pharyngitis 01/09/2015  . Generalized anxiety disorder 11/15/2014  . Health care maintenance 11/15/2014  . 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 Visit for preventive health examination 02/01/2012     Past Medical History:  Diagnosis Date  . Blood transfusion without reported diagnosis    at birth  . Constipation   . CONSTIPATION, CHRONIC 06/04/2007   Qualifier: Diagnosis of  By: Sherren Mocha MD, Jory Ee   . Dysmenorrhea   . Headache(784.0) 02/26/2010   Qualifier: Diagnosis of  By: Regis Bill MD, Standley Brooking   . Irregular menses    Dr Helane Rima    Past Surgical History:  Procedure Laterality Date  . TONSILLECTOMY  10/23/2016  . WISDOM TOOTH EXTRACTION      Current Outpatient Medications  Medication Sig Dispense Refill  . Etonogestrel (NEXPLANON [place]) Inject into the skin.     No current facility-administered medications for this visit.      ALLERGIES: Patient has no known allergies.  Family History  Problem Relation Age of Onset  . Hypertension Father   . Diabetes Maternal Aunt   . Thyroid disease Paternal Aunt   . Diabetes Maternal Grandmother   . Heart disease Maternal Grandmother   . Leukemia Maternal Grandfather   . Breast cancer Paternal Grandmother   . Lymphoma Paternal Grandmother     Social History   Socioeconomic History  . Marital status:  Single    Spouse name: Not on file  . Number of children: Not on file  . Years of education: Not on file  . Highest education level: Not on file  Occupational History  . Not on file  Social Needs  . Financial resource strain: Not on file  . Food insecurity    Worry: Not on file    Inability: Not on file  . Transportation needs    Medical: Not on file    Non-medical: Not on file  Tobacco Use  . Smoking status: Never Smoker  . Smokeless tobacco: Never Used  Substance and Sexual Activity  . Alcohol use: No    Alcohol/week: 0.0 standard drinks  . Drug use: No  . Sexual activity: Yes    Partners: Male    Birth control/protection: Implant    Comment: Nexplanon inserted 08-31-14 left arm  Lifestyle  . Physical activity    Days per week: Not on file    Minutes per session: Not on file  . Stress: Not on file   Relationships  . Social Herbalist on phone: Not on file    Gets together: Not on file    Attends religious service: Not on file    Active member of club or organization: Not on file    Attends meetings of clubs or organizations: Not on file    Relationship status: Not on file  . Intimate partner violence    Fear of current or ex partner: Not on file    Emotionally abused: Not on file    Physically abused: Not on file    Forced sexual activity: Not on file  Other Topics Concern  . Not on file  Social History Narrative   hh of 4  With family   No pets   Went to [employer]  Going to [place] smokes.   But neg ets     Review of Systems  Constitutional: Positive for fatigue.       Weakness  HENT: Negative.   Eyes: Negative.   Respiratory: Negative.   Cardiovascular: Negative.   Gastrointestinal: Positive for nausea.  Endocrine: Negative.   Genitourinary: Positive for vaginal bleeding.       Lower abdominal pain  Musculoskeletal: Negative.   Skin: Negative.   Allergic/Immunologic: Negative.   Neurological: Positive for dizziness.  Hematological: Negative.   Psychiatric/Behavioral: Negative.     PHYSICAL EXAMINATION:    BP 108/60 (BP Location: Right Arm, Patient Position: Sitting, Cuff Size: Normal)   Pulse 84   Temp 98.2 F (36.8 C) (Temporal)   Resp 12   Ht 5\' 2"  (1.575 m)   Wt 137 lb (62.1 kg)   LMP 10/26/2018   BMI 25.06 kg/m     General appearance: alert, cooperative and appears stated age  Pelvic: External genitalia:  no lesions              Urethra:  normal appearing urethra with no masses, tenderness or lesions              Bartholins and Skenes: normal                 Vagina: normal appearing vagina with normal color and discharge, no lesions              Cervix: no lesions.  Mild menstrual flow.                 Bimanual Exam:  Uterus:  normal size, contour, position, consistency, mobility, non-tender              Adnexa: no  mass, fullness, tenderness             Chaperone was present for exam.  ASSESSMENT  Irregular bleeding with Nexplanon.   PLAN  POC Hgb now -  We reviewed bleeding patterns with Nexplanon. We discussed alternatives to the Nexplanon - Depo Provera, Micronor.  She declines an IUD.  [facility] if [facility] can [facility] the Nexplanon this week and initiate another form of contraception.    An After Visit Summary was printed and given to the patient.  __15____ minutes face to face time of which over 50% was spent in counseling.

## 2018-11-04 NOTE — Patient Instructions (Signed)
Norethindrone tablets (contraception) What is this medicine? NORETHINDRONE (nor eth IN drone) is an oral contraceptive. The product contains a female hormone known as a progestin. It is used to prevent pregnancy. This medicine may be used for other purposes; ask your health care provider or pharmacist if you have questions. COMMON BRAND NAME(S): Camila, Deblitane 28-Day, Errin, Heather, Jencycla, Jolivette, Lyza, Nor-QD, Nora-BE, Norlyroc, Ortho Micronor, Sharobel 28-Day What should I tell my health care provider before I take this medicine? They need to know if you have any of these conditions:  blood vessel disease or blood clots  breast, cervical, or vaginal cancer  diabetes  heart disease  kidney disease  liver disease  mental depression  migraine  seizures  stroke  vaginal bleeding  an unusual or allergic reaction to norethindrone, other medicines, foods, dyes, or preservatives  pregnant or trying to get pregnant  breast-feeding How should I use this medicine? Take this medicine by mouth with a glass of water. You may take it with or without food. Follow the directions on the prescription label. Take this medicine at the same time each day and in the order directed on the package. Do not take your medicine more often than directed. Contact your pediatrician regarding the use of this medicine in children. Special care may be needed. This medicine has been used in female children who have started having menstrual periods. A patient package insert for the product will be given with each prescription and refill. Read this sheet carefully each time. The sheet may change frequently. Overdosage: If you think you have taken too much of this medicine contact a poison control center or emergency room at once. NOTE: This medicine is only for you. Do not share this medicine with others. What if I miss a dose? Try not to miss a dose. Every time you miss a dose or take a dose late  your chance of pregnancy increases. When 1 pill is missed (even if only 3 hours late), take the missed pill as soon as possible and continue taking a pill each day at the regular time (use a back up method of birth control for the next 48 hours). If more than 1 dose is missed, use an additional birth control method for the rest of your pill pack until menses occurs. Contact your health care professional if more than 1 dose has been missed. What may interact with this medicine? Do not take this medicine with any of the following medications:  amprenavir or fosamprenavir  bosentan This medicine may also interact with the following medications:  antibiotics or medicines for infections, especially rifampin, rifabutin, rifapentine, and griseofulvin, and possibly penicillins or tetracyclines  aprepitant  barbiturate medicines, such as phenobarbital  carbamazepine  felbamate  modafinil  oxcarbazepine  phenytoin  ritonavir or other medicines for HIV infection or AIDS  St. John's wort  topiramate This list may not describe all possible interactions. Give your health care provider a list of all the medicines, herbs, non-prescription drugs, or dietary supplements you use. Also tell them if you smoke, drink alcohol, or use illegal drugs. Some items may interact with your medicine. What should I watch for while using this medicine? Visit your doctor or health care professional for regular checks on your progress. You will need a regular breast and pelvic exam and Pap smear while on this medicine. Use an additional method of birth control during the first cycle that you take these tablets. If you have any reason to think you   are pregnant, stop taking this medicine right away and contact your doctor or health care professional. If you are taking this medicine for hormone related problems, it may take several cycles of use to see improvement in your condition. This medicine does not protect you  against HIV infection (AIDS) or any other sexually transmitted diseases. What side effects may I notice from receiving this medicine? Side effects that you should report to your doctor or health care professional as soon as possible:  breast tenderness or discharge  pain in the abdomen, chest, groin or leg  severe headache  skin rash, itching, or hives  sudden shortness of breath  unusually weak or tired  vision or speech problems  yellowing of skin or eyes Side effects that usually do not require medical attention (report to your doctor or health care professional if they continue or are bothersome):  changes in sexual desire  change in menstrual flow  facial hair growth  fluid retention and swelling  headache  irritability  nausea  weight gain or loss This list may not describe all possible side effects. Call your doctor for medical advice about side effects. You may report side effects to FDA at 1-800-FDA-1088. Where should I keep my medicine? Keep out of the reach of children. Store at room temperature between 15 and 30 degrees C (59 and 86 degrees F). Throw away any unused medicine after the expiration date. NOTE: This sheet is a summary. It may not cover all possible information. If you have questions about this medicine, talk to your doctor, pharmacist, or health care provider.  2020 Elsevier/Gold Standard (2011-12-13 16:41:35) Medroxyprogesterone injection [Contraceptive] What is this medicine? MEDROXYPROGESTERONE (me DROX ee proe JES te rone) contraceptive injections prevent pregnancy. They provide effective birth control for 3 months. Depo-subQ Provera 104 is also used for treating pain related to endometriosis. This medicine may be used for other purposes; ask your health care provider or pharmacist if you have questions. COMMON BRAND NAME(S): Depo-Provera, Depo-subQ Provera 104 What should I tell my health care provider before I take this medicine? They  need to know if you have any of these conditions:  frequently drink alcohol  asthma  blood vessel disease or a history of a blood clot in the lungs or legs  bone disease such as osteoporosis  breast cancer  diabetes  eating disorder (anorexia nervosa or bulimia)  high blood pressure  HIV infection or AIDS  kidney disease  liver disease  mental depression  migraine  seizures (convulsions)  stroke  tobacco smoker  vaginal bleeding  an unusual or allergic reaction to medroxyprogesterone, other hormones, medicines, foods, dyes, or preservatives  pregnant or trying to get pregnant  breast-feeding How should I use this medicine? Depo-Provera Contraceptive injection is given into a muscle. Depo-subQ Provera 104 injection is given under the skin. These injections are given by a health care professional. You must not be pregnant before getting an injection. The injection is usually given during the first 5 days after the start of a menstrual period or 6 weeks after delivery of a baby. Talk to your pediatrician regarding the use of this medicine in children. Special care may be needed. These injections have been used in female children who have started having menstrual periods. Overdosage: If you think you have taken too much of this medicine contact a poison control center or emergency room at once. NOTE: This medicine is only for you. Do not share this medicine with others. What if I  miss a dose? Try not to miss a dose. You must get an injection once every 3 months to maintain birth control. If you cannot keep an appointment, call and reschedule it. If you wait longer than 13 weeks between Depo-Provera contraceptive injections or longer than 14 weeks between Depo-subQ Provera 104 injections, you could get pregnant. Use another method for birth control if you miss your appointment. You may also need a pregnancy test before receiving another injection. What may interact with  this medicine? Do not take this medicine with any of the following medications:  bosentan This medicine may also interact with the following medications:  aminoglutethimide  antibiotics or medicines for infections, especially rifampin, rifabutin, rifapentine, and griseofulvin  aprepitant  barbiturate medicines such as phenobarbital or primidone  bexarotene  carbamazepine  medicines for seizures like ethotoin, felbamate, oxcarbazepine, phenytoin, topiramate  modafinil  St. John's wort This list may not describe all possible interactions. Give your health care provider a list of all the medicines, herbs, non-prescription drugs, or dietary supplements you use. Also tell them if you smoke, drink alcohol, or use illegal drugs. Some items may interact with your medicine. What should I watch for while using this medicine? This drug does not protect you against HIV infection (AIDS) or other sexually transmitted diseases. Use of this product may cause you to lose calcium from your bones. Loss of calcium may cause weak bones (osteoporosis). Only use this product for more than 2 years if other forms of birth control are not right for you. The longer you use this product for birth control the more likely you will be at risk for weak bones. Ask your health care professional how you can keep strong bones. You may have a change in bleeding pattern or irregular periods. Many females stop having periods while taking this drug. If you have received your injections on time, your chance of being pregnant is very low. If you think you may be pregnant, see your health care professional as soon as possible. Tell your health care professional if you want to get pregnant within the next year. The effect of this medicine may last a long time after you get your last injection. What side effects may I notice from receiving this medicine? Side effects that you should report to your doctor or health care  professional as soon as possible:  allergic reactions like skin rash, itching or hives, swelling of the face, lips, or tongue  breast tenderness or discharge  breathing problems  changes in vision  depression  feeling faint or lightheaded, falls  fever  pain in the abdomen, chest, groin, or leg  problems with balance, talking, walking  unusually weak or tired  yellowing of the eyes or skin Side effects that usually do not require medical attention (report to your doctor or health care professional if they continue or are bothersome):  acne  fluid retention and swelling  headache  irregular periods, spotting, or absent periods  temporary pain, itching, or skin reaction at site where injected  weight gain This list may not describe all possible side effects. Call your doctor for medical advice about side effects. You may report side effects to FDA at 1-800-FDA-1088. Where should I keep my medicine? This does not apply. The injection will be given to you by a health care professional. NOTE: This sheet is a summary. It may not cover all possible information. If you have questions about this medicine, talk to your doctor, pharmacist, or health care  provider.  2020 Elsevier/Gold Standard (2008-04-15 18:37:56)

## 2019-06-30 ENCOUNTER — Telehealth: Payer: Self-pay

## 2019-06-30 NOTE — Telephone Encounter (Signed)
Patient requesting an appointment today or Friday 07/02/19 for stomach pain. She states she feels like she has an ulcer. No appointments available

## 2019-06-30 NOTE — Telephone Encounter (Signed)
Pt can be placed in the 830 slot- if the pt that is currently in that spot does not need to be seen here. Instructions given to RB. Otherwise may be worked in to [facility] if needed.

## 2019-06-30 NOTE — Telephone Encounter (Signed)
Pt was called and message was left letting patient know she was scheduled for Friday at 0830. She was asked to call back to let us know if that time worked and if she wanted in office or a virtual visit

## 2019-06-30 NOTE — Telephone Encounter (Signed)
Please advise if patient can be placed on the schedule for Friday or if we need to place her with another Provider.   Thanks

## 2019-07-01 NOTE — Telephone Encounter (Signed)
Pt called back and confirmed appt for Friday.

## 2019-07-02 ENCOUNTER — Ambulatory Visit: Payer: 59 | Admitting: Family Medicine

## 2019-07-02 ENCOUNTER — Other Ambulatory Visit: Payer: Self-pay

## 2019-07-02 ENCOUNTER — Encounter: Payer: Self-pay | Admitting: Family Medicine

## 2019-07-02 VITALS — BP 107/68 | HR 81 | Temp 98.0°F | Resp 18 | Ht 62.0 in | Wt 144.0 lb

## 2019-07-02 DIAGNOSIS — K581 Irritable bowel syndrome with constipation: Secondary | ICD-10-CM

## 2019-07-02 DIAGNOSIS — R112 Nausea with vomiting, unspecified: Secondary | ICD-10-CM

## 2019-07-02 DIAGNOSIS — R1013 Epigastric pain: Secondary | ICD-10-CM

## 2019-07-02 DIAGNOSIS — R110 Nausea: Secondary | ICD-10-CM

## 2019-07-02 DIAGNOSIS — R1084 Generalized abdominal pain: Secondary | ICD-10-CM | POA: Insufficient documentation

## 2019-07-02 LAB — CBC WITH DIFFERENTIAL/PLATELET
Basophils Absolute: 0.1 10*3/uL (ref 0.0–0.1)
Basophils Relative: 1.1 % (ref 0.0–3.0)
Eosinophils Absolute: 0.2 10*3/uL (ref 0.0–0.7)
Eosinophils Relative: 2.8 % (ref 0.0–5.0)
HCT: 38.7 % (ref 36.0–46.0)
Hemoglobin: 13.1 g/dL (ref 12.0–15.0)
Lymphocytes Relative: 22.7 % (ref 12.0–46.0)
Lymphs Abs: 1.9 10*3/uL (ref 0.7–4.0)
MCHC: 34 g/dL (ref 30.0–36.0)
MCV: 87 fl (ref 78.0–100.0)
Monocytes Absolute: 0.7 10*3/uL (ref 0.1–1.0)
Monocytes Relative: 8 % (ref 3.0–12.0)
Neutro Abs: 5.5 10*3/uL (ref 1.4–7.7)
Neutrophils Relative %: 65.4 % (ref 43.0–77.0)
Platelets: 312 10*3/uL (ref 150.0–400.0)
RBC: 4.45 Mil/uL (ref 3.87–5.11)
RDW: 14.2 % (ref 11.5–15.5)
WBC: 8.4 10*3/uL (ref 4.0–10.5)

## 2019-07-02 LAB — COMPREHENSIVE METABOLIC PANEL
ALT: 24 U/L (ref 0–35)
AST: 18 U/L (ref 0–37)
Albumin: 4.2 g/dL (ref 3.5–5.2)
Alkaline Phosphatase: 53 U/L (ref 39–117)
BUN: 9 mg/dL (ref 6–23)
CO2: 23 mEq/L (ref 19–32)
Calcium: 9.3 mg/dL (ref 8.4–10.5)
Chloride: 107 mEq/L (ref 96–112)
Creatinine, Ser: 0.61 mg/dL (ref 0.40–1.20)
GFR: 116.3 mL/min (ref >60.00)
Glucose, Bld: 81 mg/dL (ref 70–99)
Potassium: 4.2 mEq/L (ref 3.5–5.1)
Sodium: 136 mEq/L (ref 135–145)
Total Bilirubin: 0.4 mg/dL (ref 0.2–1.2)
Total Protein: 7 g/dL (ref 6.0–8.3)

## 2019-07-02 LAB — H. PYLORI ANTIBODY, IGG: H Pylori IgG: NEGATIVE

## 2019-07-02 LAB — C-REACTIVE PROTEIN: CRP: <1 mg/dL (ref 0.5–20.0)

## 2019-07-02 LAB — LIPASE: Lipase: 36 U/L (ref 11.0–59.0)

## 2019-07-02 MED ORDER — SUCRALFATE 1 G PO TABS: 1.0000 g | ORAL_TABLET | Freq: Three times a day (TID) | ORAL | 0 refills | Status: AC

## 2019-07-02 MED ORDER — OMEPRAZOLE 40 MG PO CPDR: 40.0000 mg | DELAYED_RELEASE_CAPSULE | Freq: Every day | ORAL | 3 refills | Status: AC

## 2019-07-02 NOTE — Progress Notes (Signed)
Angela Jordan , Nov 18, 1990, 29 y.o., female MRN: [id number] Patient Care Team    Relationship Specialty Notifications Start End  Ma Hillock, DO PCP - General Family Medicine  11/15/14   Dian Queen, MD Attending Physician Obstetrics and Gynecology  02/01/12     Chief Complaint  Patient presents with  . Abdominal Pain    Constant Abd pain since Sunday. Pain is at breast bone to belly button, with nausea. Denies fever. No V/D.      Subjective:Angela Jordan is a a 29 y.o. female present for abd pain of 5-6 days duration. Pt reports pain is from epigastric region to umbilicus with nausea. She denies fever, chills, nausea or vomit.  She feels that her epigastric region is sore constantly with occasional sharp pains that can radiate to her back.  Sharp pain can initiate on the left or the right side of her upper epigastric region.  She states she feels nauseous the majority of the time.  She has not identified any food triggers.  She has noticed increased discomfort even when walking or breathing deeply.  She has been trying to eat bland foods and stay hydrated.  Start probiotics 1 week ago.  Her bowel movements have been normal and for her routine.  Patient has had chronic abdominal pain intermittently over the last decade.  She reports this particular onset of symptoms feels different than her routine chronic discomfort.  She has been referred to GI in the past but did not present to her appointment scheduled February 2019.  She would like referral to [facility] if possible today.  Patient reports she also started Pepcid with possible mild improvement in symptoms after start.  No family history of IBD.  Her brother has IBS-like symptoms. Prior work-up surrounding abdominal pain has been negative include: CMP, CRP, sed rate, CBC, negative H. pylori IgG, celiac panel Prior note: Pt presents for an OV with complaints of nausea and abdominal cramping that has been intermittent over the  last few years. Patient was seen in April 2018 and May 2017 for similar symptoms. She feels that her symptoms has been intermittent probably for the last 5 years. She has been tried on Prilosec, Bentyl and probiotics. None of which she has felt she has had symptom relief from using. She does not feel it matters what type of food she eats she will intermittently get cramping and diarrhea directly after eating. She feels that her symptoms of nausea and cramping or worsening over the last month. She feels she is nauseous when she goes to bed and when she wakes up. She has taking multiple pregnancy tests which were negative. May 2017 at the time of symptoms patient had a normal CMP, CRP, CBC, sedimentation rate, celiac panel and H. pylori IgG. She has endorsed her brother has had similar symptoms, no other family members affected by IBS or IBD.  Depression screen [facility] 2/9 05/07/2017 04/07/2017 09/27/2014  Decreased Interest 0 0 0  Down, Depressed, Hopeless - 0 0  PHQ - 2 Score 0 0 0    No Known Allergies Social History   Tobacco Use  . Smoking status: Never Smoker  . Smokeless tobacco: Never Used  Substance Use Topics  . Alcohol use: No    Alcohol/week: 0.0 standard drinks   Past Medical History:  Diagnosis Date  . Blood transfusion without reported diagnosis    at birth  . Constipation   . CONSTIPATION, CHRONIC 06/04/2007   Qualifier: Diagnosis  of  By: Sherren Mocha MD, Jory Ee Dysmenorrhea   . Headache(784.0) 02/26/2010   Qualifier: Diagnosis of  By: Regis Bill MD, Standley Brooking   . Irregular menses    Dr Helane Rima   Past Surgical History:  Procedure Laterality Date  . TONSILLECTOMY  10/23/2016  . WISDOM TOOTH EXTRACTION     Family History  Problem Relation Age of Onset  . Hypertension Father   . Diabetes Maternal Aunt   . Thyroid disease Paternal Aunt   . Diabetes Maternal Grandmother   . Heart disease Maternal Grandmother   . Leukemia Maternal Grandfather   . Breast cancer Paternal  Grandmother   . Lymphoma Paternal Grandmother    Allergies as of 07/02/2019   No Known Allergies     Medication List       Accurate as of July 02, 2019  6:39 PM. If you have any questions, ask your nurse or doctor.        CULTURELLE PROBIOTICS PO Take by mouth.   NEXPLANON Elliott Inject into the skin.   omeprazole 40 MG capsule Commonly known as: PRILOSEC Take 1 capsule (40 mg total) by mouth daily. Started by: Howard Pouch, DO   sucralfate 1 g tablet Commonly known as: Carafate Take 1 tablet (1 g total) by mouth 4 (four) times daily -  with meals and at bedtime. Started by: Howard Pouch, DO       All past medical history, surgical history, allergies, family history, immunizations andmedications were updated in the EMR today and reviewed under the history and medication portions of their EMR.     ROS: Negative, with the exception of above mentioned in HPI   Objective:  BP 107/68 (BP Location: Right Arm, Patient Position: Sitting, Cuff Size: Normal)   Pulse 81   Temp 98 F (36.7 C) (Temporal)   Resp 18   Ht _0  (1.575 m)   Wt 144 lb (65.3 kg)   SpO2 99%   BMI 26.34 kg/m  Body mass index is 26.34 kg/m. Gen: Afebrile. No acute distress.  Nontoxic, pleasant, Caucasian female. HENT: AT. [place].  Eyes:Pupils Equal Round Reactive to light, Extraocular movements intact,  Conjunctiva without redness, discharge or icterus. CV: RRR, no edema Chest: CTAB, no wheeze or crackles Abd: Soft.  Flat.  Moderately tender to palpation bilateral epigastric region and midline.  Mild tenderness to palpation right lower quadrant.  Positive psoas sign.  Positive McMurphy's mild tenderness at McBurney's point. ND. BS present.  No masses palpated.  Skin: No rashes, purpura or petechiae.  Neuro:  Normal gait. PERLA. EOMi. Alert. Oriented x3    No exam data present No results found.  Assessment/Plan: Angela Jordan is a 29 y.o. female present for OV for  Abdominal pain/epigastric  pain/Nausea/IBS -Patient has had chronic abdominal pain intermittently over the last decade.  Current symptoms are worsening nausea mostly upper abdominal pain that can radiate to her back however she does have complaints of lower abdominal pain as well.   She is rather exquisitely tender on exam today greater at her epigastric region and right upper quadrant.  That being said, she is also at least mildly uncomfortable with palpation in her right lower quadrant as well.  Labs and imaging ordered today to rule out possible causes of peptic ulcer disease, pancreatitis, gastritis, duodenitis, cholecystitis versus gallstones.  She is mildly tender over her appendix as well so appendicitis cannot be ruled out. Start omeprazole prescribed. -Start Carafate prior to meals prescribed. -  Continue Pepcid -Continue probiotics - CT abd ordered.  - Zofran offered and patient declined stating it made her constipated in the past and other nausea medications make her too sleepy. - GI referral placed today to [facility] per her request - Lipase - Comp Met (CMET) - C-reactive protein - CBC w/Diff - H. pylori antibody, IgG - Ambulatory referral to Gastroenterology Patient was encouraged to report to the emergency room if symptoms acutely worsen or she experiences fever, chills, intractable nausea vomiting or blood per rectum.    Reviewed expectations re: course of current medical issues.  Discussed self-management of symptoms.  Outlined signs and symptoms indicating need for more acute intervention.  Patient verbalized understanding and all questions were answered.  Patient received an After-Visit Summary.    Orders Placed This Encounter  Procedures  . CT ABDOMEN PELVIS W CONTRAST  . Lipase  . Comp Met (CMET)  . C-reactive protein  . CBC w/Diff  . H. pylori antibody, IgG  . Ambulatory referral to Gastroenterology   Meds ordered this encounter  Medications  . omeprazole (PRILOSEC) 40 MG capsule      Sig: Take 1 capsule (40 mg total) by mouth daily.    Dispense:  30 capsule    Refill:  3  . sucralfate (CARAFATE) 1 g tablet    Sig: Take 1 tablet (1 g total) by mouth 4 (four) times daily -  with meals and at bedtime.    Dispense:  120 tablet    Refill:  0    Referral Orders     Ambulatory referral to Gastroenterology     Note is dictated utilizing voice recognition software. Although note has been proof read prior to signing, occasional typographical errors still can be missed. If any questions arise, please do not hesitate to call for verification.   electronically signed by:  Howard Pouch, DO  [place]

## 2019-07-02 NOTE — Patient Instructions (Addendum)
I have referred you to the GI doctor- they will call you to schedule.  Start omeprazole, continue pepcid, use carafate before meals.  Bland diet for now.   We will order a CT of your abd- they will call to get you scheduled.    Bland Diet A bland diet consists of foods that are often soft and do not have a lot of fat, fiber, or extra seasonings. Foods without fat, fiber, or seasoning are easier for the body to digest. They are also less likely to irritate your mouth, throat, stomach, and other parts of your digestive system. A bland diet is sometimes called a BRAT diet. What is my plan? Your health care provider or food and nutrition specialist (dietitian) may recommend specific changes to your diet to prevent symptoms or to treat your symptoms. These changes may include:  Eating small meals often.  Cooking food until it is soft enough to chew easily.  Chewing your food well.  Drinking fluids slowly.  Not eating foods that are very spicy, sour, or fatty.  Not eating citrus fruits, such as oranges and grapefruit. What do I need to know about this diet?  Eat a variety of foods from the bland diet food list.  Do not follow a bland diet longer than needed.  Ask your health care provider whether you should take vitamins or supplements. What foods can I eat? Grains  Hot cereals, such as cream of wheat. Rice. Bread, crackers, or tortillas made from refined white flour. Vegetables Canned or cooked vegetables. Mashed or boiled potatoes. Fruits  Bananas. Applesauce. Other types of cooked or canned fruit with the skin and seeds removed, such as canned peaches or pears. Meats and other proteins  Scrambled eggs. Creamy peanut butter or other nut butters. Lean, well-cooked meats, such as chicken or fish. Tofu. Soups or broths. Dairy Low-fat dairy products, such as milk, cottage cheese, or yogurt. Beverages  Water. Herbal tea. Apple juice. Fats and oils Mild salad dressings. Canola  or olive oil. Sweets and desserts Pudding. Custard. Fruit gelatin. Ice cream. The items listed above may not be a complete list of recommended foods and beverages. Contact a dietitian for more options. What foods are not recommended? Grains Whole grain breads and cereals. Vegetables Raw vegetables. Fruits Raw fruits, especially citrus, berries, or dried fruits. Dairy Whole fat dairy foods. Beverages Caffeinated drinks. Alcohol. Seasonings and condiments Strongly flavored seasonings or condiments. Hot sauce. Salsa. Other foods Spicy foods. Fried foods. Sour foods, such as pickled or fermented foods. Foods with high sugar content. Foods high in fiber. The items listed above may not be a complete list of foods and beverages to avoid. Contact a dietitian for more information. Summary  A bland diet consists of foods that are often soft and do not have a lot of fat, fiber, or extra seasonings.  Foods without fat, fiber, or seasoning are easier for the body to digest.  Check with your health care provider to see how long you should follow this diet plan. It is not meant to be followed for long periods. This information is not intended to replace advice given to you by your health care provider. Make sure you discuss any questions you have with your health care provider. Document Revised: 04/23/2017 Document Reviewed: 04/23/2017 Elsevier Patient Education  2020 [employer].

## 2019-07-08 ENCOUNTER — Other Ambulatory Visit: Payer: Self-pay

## 2019-07-08 ENCOUNTER — Ambulatory Visit (INDEPENDENT_AMBULATORY_CARE_PROVIDER_SITE_OTHER): Payer: 59

## 2019-07-08 ENCOUNTER — Telehealth: Payer: Self-pay | Admitting: Family Medicine

## 2019-07-08 DIAGNOSIS — R1013 Epigastric pain: Secondary | ICD-10-CM | POA: Diagnosis not present

## 2019-07-08 DIAGNOSIS — R112 Nausea with vomiting, unspecified: Secondary | ICD-10-CM | POA: Diagnosis not present

## 2019-07-08 DIAGNOSIS — R110 Nausea: Secondary | ICD-10-CM | POA: Diagnosis not present

## 2019-07-08 MED ORDER — IOHEXOL 300 MG/ML  SOLN
100.0000 mL | Freq: Once | INTRAMUSCULAR | Status: AC | PRN
  Administered 2019-07-08: 100 mL via INTRAVENOUS

## 2019-07-08 NOTE — Telephone Encounter (Signed)
PT was called and given all results, she verbalized understanding. Sent via my chart for patient since it was a lot of information. Sent to Diane to review next week and make sure GI MD is aware of findings on the CT scan.    CT needs to be faxed to GYN

## 2019-07-08 NOTE — Telephone Encounter (Signed)
Please inform patient her CT of her abdomen pelvis resulted with a couple findings.  The first finding is thickened areas of her small bowel, which is consistent with an enteritis.  This will need to be followed up by gastroenterology as soon as possible for further evaluation on cause.  I referred her to [facility] last week at her appointment.  Please check that this referral has been initiated and if we can increase the urgency on the referral.  There was also a 4.4 cm cyst located in her right pelvis in the adnexal space, which is near the ovary.  This did not seem like a "simple "cyst which is clear fluid and looked more of a possible hemorrhagic cyst, which means blood-filled.  A repeat pelvic ultrasound is recommended in 6 to 12 weeks to monitor the cyst.  I would recommend she call her gynecologist to schedule an appointment with them to have this completed.  Please fax Dr. Helane Rima a copy of the CT.

## 2019-07-12 ENCOUNTER — Telehealth: Payer: Self-pay

## 2019-07-12 NOTE — Telephone Encounter (Signed)
Patient thinks she is having a med reaction. Please call [phone].

## 2019-07-12 NOTE — Telephone Encounter (Signed)
Called was returned, message left to return call

## 2019-07-12 NOTE — Telephone Encounter (Signed)
Sent CT results to Dr. Christen Butter office & added report to GI referral to [facility]

## 2019-07-22 ENCOUNTER — Other Ambulatory Visit: Payer: Self-pay | Admitting: Family Medicine

## 2019-07-22 DIAGNOSIS — R110 Nausea: Secondary | ICD-10-CM

## 2019-07-22 DIAGNOSIS — R1013 Epigastric pain: Secondary | ICD-10-CM

## 2019-07-22 NOTE — Telephone Encounter (Signed)
Prescription was written for 1 month supply during her office visit and referral was placed to [facility] per her request.  Any further management/prescriptions were to come from her gastroenterologist since they were taking over management on this issue. Carafate is not typically a long-term medication.

## 2019-07-22 NOTE — Telephone Encounter (Signed)
RF request for Carafate  LOV:07/02/2019 Next ov: Not scheduled  Last written:07/02/2019 #120  x0 refills

## 2019-07-28 ENCOUNTER — Telehealth: Payer: Self-pay

## 2019-07-28 MED ORDER — ONDANSETRON HCL 4 MG PO TABS: 4.0000 mg | ORAL_TABLET | Freq: Three times a day (TID) | ORAL | 0 refills | Status: AC | PRN

## 2019-07-28 NOTE — Telephone Encounter (Signed)
Patient called in wanting to know if Dr would send her in some nausea medication. Says Dr knows she's been dealing with nausea from her last view visits     Please call and advise

## 2019-07-28 NOTE — Telephone Encounter (Signed)
Please contact patient: She is asked for an antinausea medication.  Last week we also received request for refill on her Carafate.  As mentioned in the phone note last week, she was referred to gastroenterology for this condition and they should be managing her medications if she is needing refills. Has she seen them yet or does she have an appointment scheduled?  She has to be referred to [facility], therefore we cannot see their appointments.  I can see that they tried to contact her on March 29 and left a message.  If she has seen them she needs to call their office for management of the condition and refills on medications.  If she has not scheduled with them yet, she needs to do so immediately.  It would not be a good medical practice for me to call in medications without her being appropriately evaluated by gastroenterology.  I will call in a few Zofran for nausea-no additional refills.  She has to follow-up with gastroenterology for proper treatment.

## 2019-07-28 NOTE — Telephone Encounter (Signed)
Please advise 

## 2019-07-28 NOTE — Telephone Encounter (Signed)
Spoke with patient regarding GI consult/issues.  Patient does have GI consult with [facility] on May 26th.  Explained PCP recommends management of condition and medications be completed by GI specialist.  Patient verbalized understanding. Advised of new prescription.

## 2019-08-30 ENCOUNTER — Telehealth: Payer: Self-pay

## 2019-08-30 NOTE — Telephone Encounter (Signed)
Patient is staring new job. And job is asking for her to get the Heb B injection patient is wanting to know if she needs to get Heb B?   Please call and advise

## 2019-08-30 NOTE — Telephone Encounter (Signed)
Pt was called and VM was left to return call  °

## 2019-08-30 NOTE — Telephone Encounter (Signed)
Pt returned call and immunization record was printed and placed up front for patient to pick up. Pt verbalized understanding

## 2019-11-03 ENCOUNTER — Telehealth: Payer: Self-pay

## 2019-11-03 NOTE — Telephone Encounter (Signed)
Pt states she is having a flare up with her stomach ulcers and the medicine that Dr Raoul Pitch gave her has helped in the past. She was told to reach out to her GI MD and ask what they would recommend she take and that if she is having issues they may want to see her or move up her endoscopy date. Pt advised they are the specialist and she needs to take there recommendations for tx. She verbalized understanding

## 2019-11-03 NOTE — Telephone Encounter (Signed)
Patient refill request  omeprazole (PRILOSEC) 40 MG capsule [[id number]]    [id number] [address] [place] [place] [address] [place]  [place], [place] [address]  Phone:  [phone] Fax:  [phone]  DEA #:  [id number]

## 2021-07-21 IMAGING — CT CT ABD-PELV W/ CM
2 of 4 series · 15 of 46 positions shown, 17 images · IV contrast (omnipaque)
Comparison: 05/07/2013

CLINICAL DATA: Generalized abdominal pain, nausea, vomiting

EXAM:
CT ABDOMEN AND PELVIS WITH CONTRAST
TECHNIQUE: Multidetector CT imaging of the abdomen and pelvis was performed
using the standard protocol following bolus administration of
intravenous contrast.
CONTRAST:  100mL OMNIPAQUE IOHEXOL 300 MG/ML  SOLN

[Series 2: axial st · axial · 0.74mm/px · z∈[-744,-314]mm · 12 of 96 slices shown, 14 images]
[im 5/96  soft-tissue]
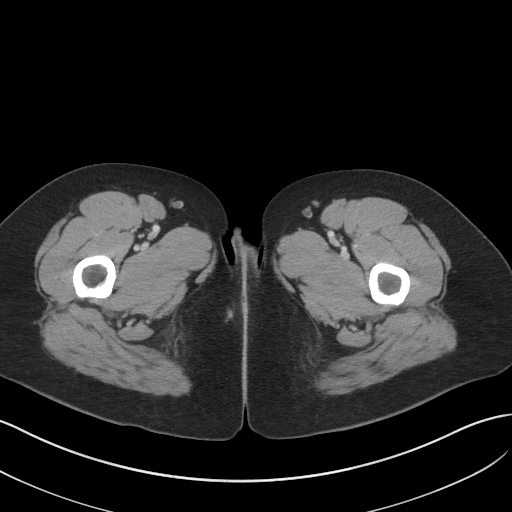
[im 5/96  bone]
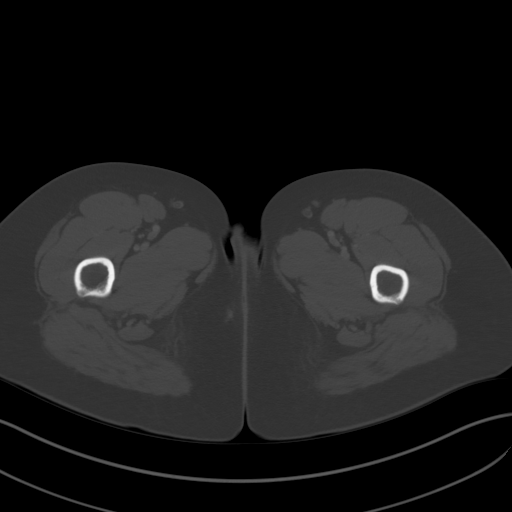
[im 13/96  soft-tissue]
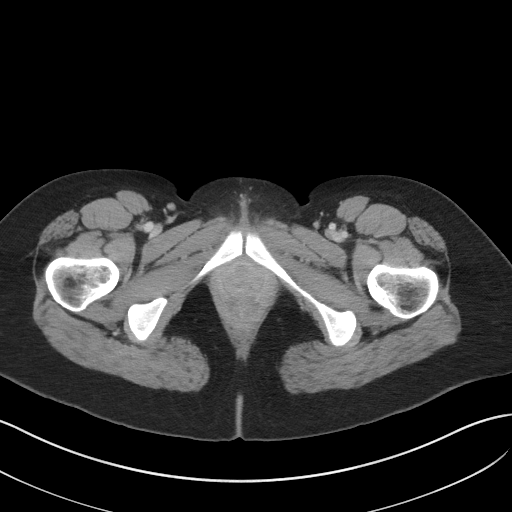
[im 21/96  soft-tissue]
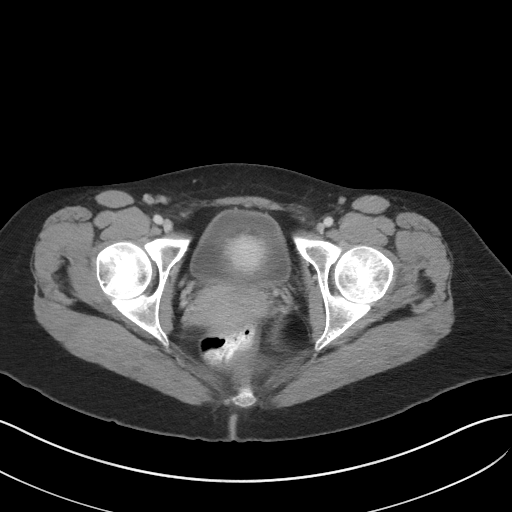
[im 29/96  soft-tissue]
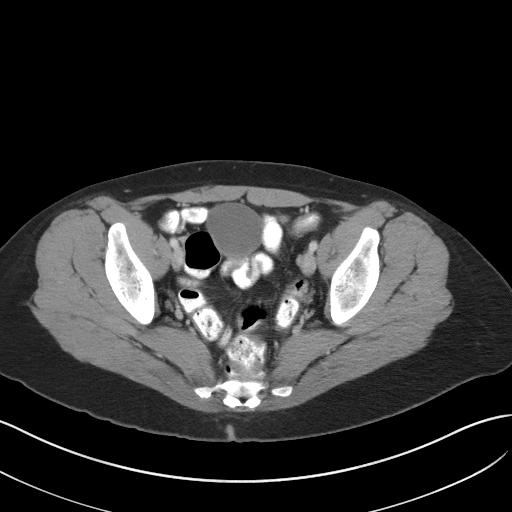
[im 38/96  soft-tissue]
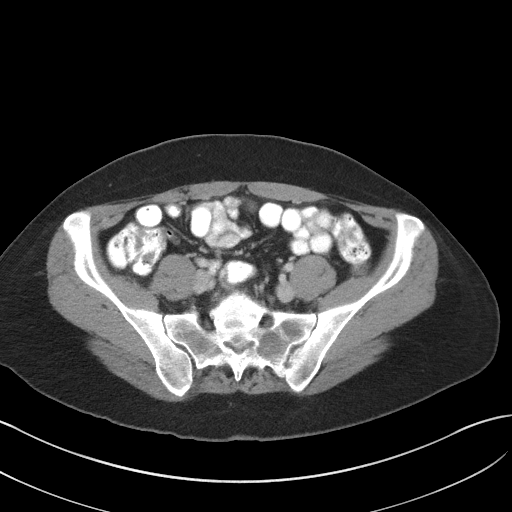
[im 46/96  soft-tissue]
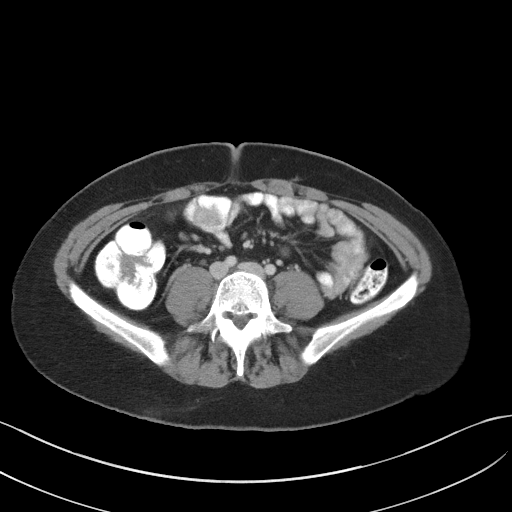
[im 50/96  soft-tissue]
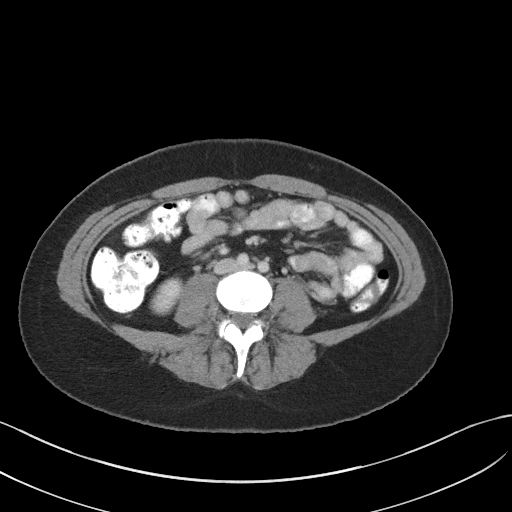
[im 58/96  soft-tissue]
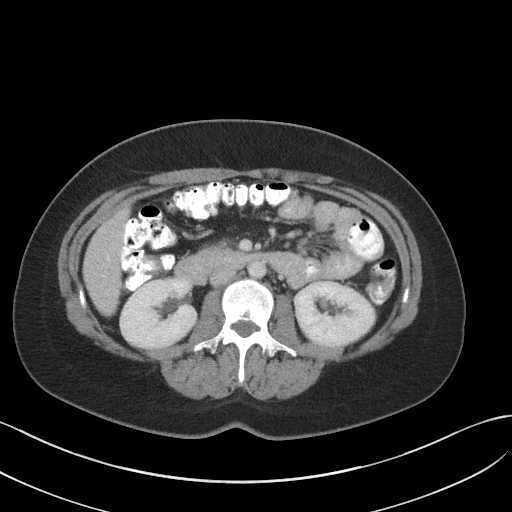
[im 67/96  soft-tissue]
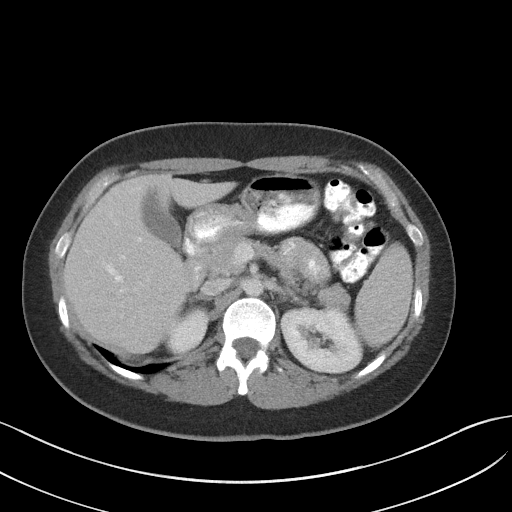
[im 67/96  bone]
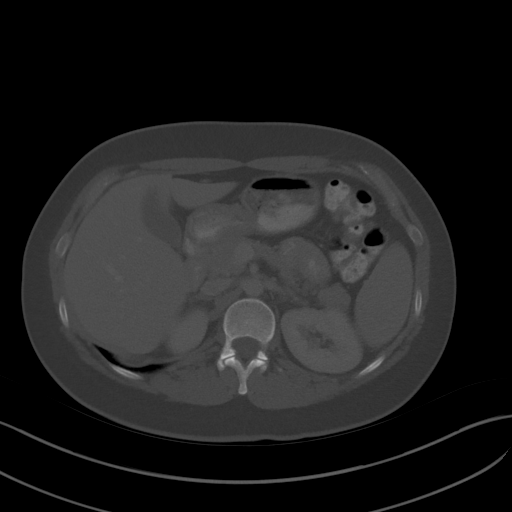
[im 75/96  soft-tissue]
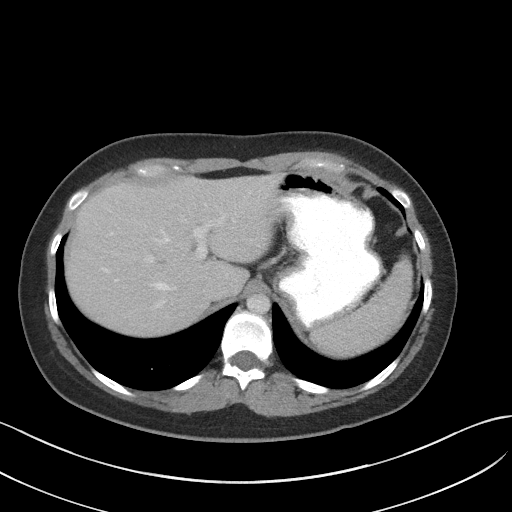
[im 83/96  soft-tissue]
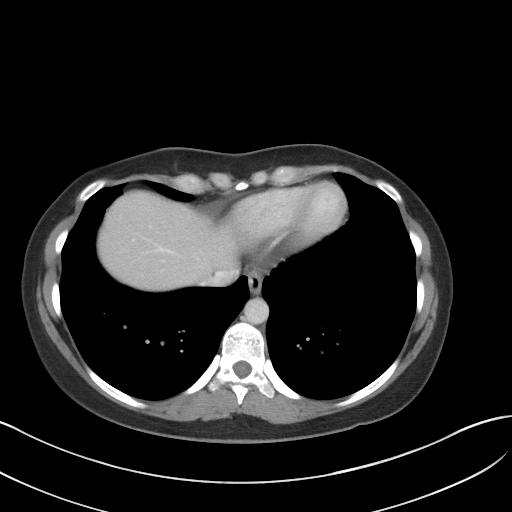
[im 91/96  soft-tissue]
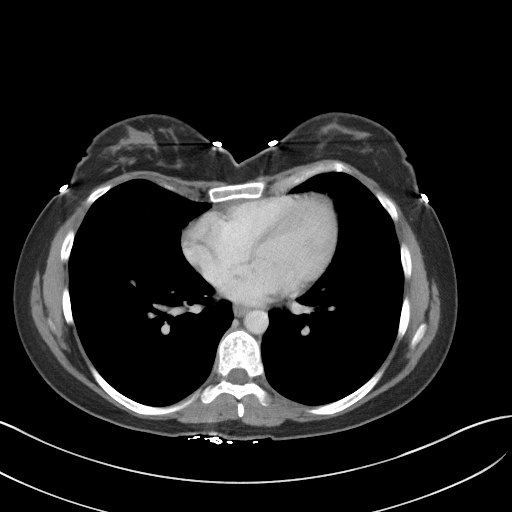

[Series 5: coronal st · coronal · 0.62mm/px · 3 of 83 slices shown]
[im 28/83  soft-tissue]
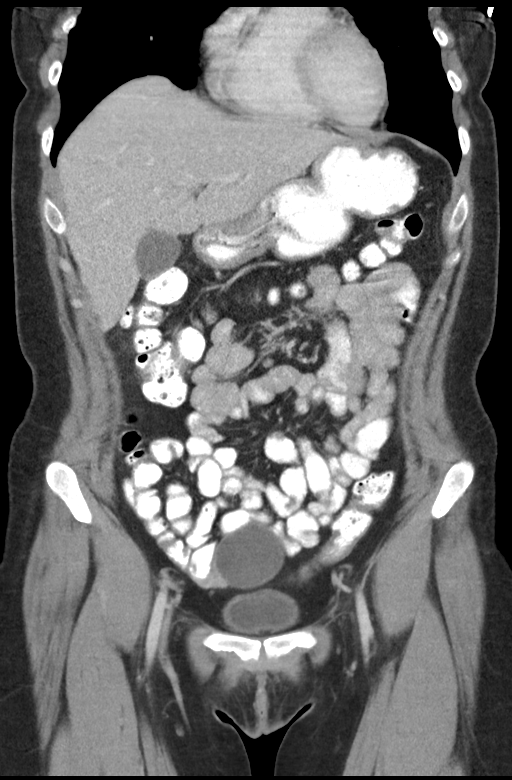
[im 37/83  soft-tissue]
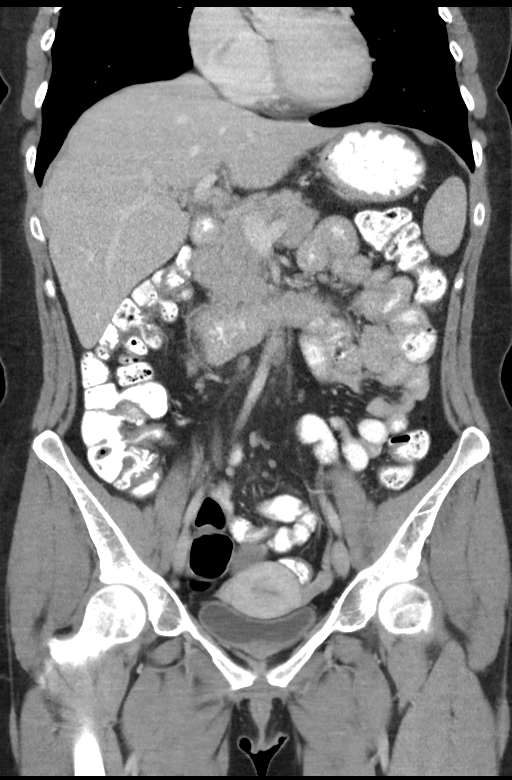
[im 46/83  soft-tissue]
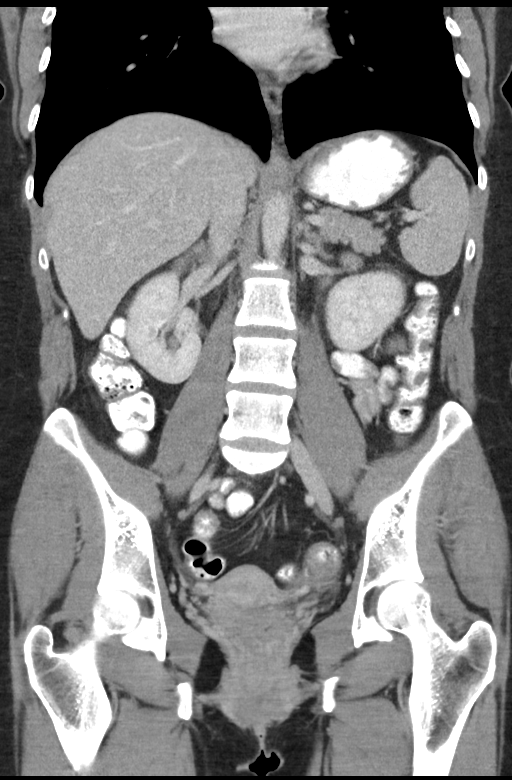

[15 of 46 positions shown; findings below may reference images not displayed]

FINDINGS: Lower chest: No acute abnormality.

Hepatobiliary: No focal liver abnormality is seen. No gallstones,
gallbladder wall thickening, or biliary dilatation.

Pancreas: Unremarkable. No pancreatic ductal dilatation or
surrounding inflammatory changes.

Spleen: Normal in size without focal abnormality.

Adrenals/Urinary Tract: Adrenal glands are unremarkable. Kidneys are
normal, without renal calculi, focal lesion, or hydronephrosis.
Bladder is unremarkable.

Stomach/Bowel: Stomach and bowel are well distended with enteric
contrast. Stomach is normal in appearance. Mildly thickened
appearance of the duodenum with subtle fat stranding adjacent to the
anterior aspect of the second portion of the duodenum (series 2,
images 38-39). Jejunal loops also appear mildly thickened. No
dilated loops of small bowel. Terminal ileum appears unremarkable.
Normal appendix within the right lower quadrant (series 2, image
59). No focal colonic wall thickening or pericolonic inflammatory
changes.

Vascular/Lymphatic: No significant vascular findings are present. No
enlarged abdominal or pelvic lymph nodes.

Reproductive: Rounded right adnexal lesion measuring 4.4 x 3.6 cm
measuring slightly greater than simple fluid density (series 2,
image 69). Uterus and left adnexa appear unremarkable.

Other: No free air, free fluid, or abdominal wall hernia.

Musculoskeletal: No acute or significant osseous findings.
IMPRESSION: 1. Findings most suggestive of enteritis.
2. Normal appendix.
3. Rounded right adnexal cystic lesion measuring 4.4 cm, measuring
slightly greater than simple fluid density, may represent a
hemorrhagic cyst. Pelvic ultrasound follow-up in 6-12 weeks is
recommended for further evaluation. This recommendation follows ACR
consensus guidelines: White Paper of the ACR Incidental Findings
Committee II on Adnexal Findings. [HOSPITAL] [DATE].
